# Patient Record
Sex: Male | Born: 1980
Health system: Southern US, Community
[De-identification: ages and names within clinical notes are randomized; demographics above are authoritative.]

## PROBLEM LIST (undated history)

## (undated) DIAGNOSIS — J309 Allergic rhinitis, unspecified: Secondary | ICD-10-CM

## (undated) DIAGNOSIS — T4145XA Adverse effect of unspecified anesthetic, initial encounter: Secondary | ICD-10-CM

## (undated) DIAGNOSIS — S8990XA Unspecified injury of unspecified lower leg, initial encounter: Secondary | ICD-10-CM

## (undated) DIAGNOSIS — M25851 Other specified joint disorders, right hip: Secondary | ICD-10-CM

## (undated) HISTORY — DX: Allergic rhinitis, unspecified: J30.9

## (undated) HISTORY — DX: Unspecified injury of unspecified lower leg, initial encounter: S89.90XA

## (undated) HISTORY — PX: WISDOM TOOTH EXTRACTION: SHX21

---
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
## 2007-04-05 ENCOUNTER — Emergency Department: Payer: Self-pay

## 2008-09-29 ENCOUNTER — Ambulatory Visit: Payer: Self-pay | Admitting: Family Medicine

## 2008-09-29 DIAGNOSIS — J309 Allergic rhinitis, unspecified: Secondary | ICD-10-CM | POA: Insufficient documentation

## 2008-09-29 DIAGNOSIS — R5383 Other fatigue: Secondary | ICD-10-CM

## 2008-09-29 DIAGNOSIS — R5381 Other malaise: Secondary | ICD-10-CM

## 2008-09-30 LAB — CONVERTED CEMR LAB
AST: 22 units/L (ref 0–37)
Albumin: 4.6 g/dL (ref 3.5–5.2)
Alkaline Phosphatase: 47 units/L (ref 39–117)
BUN: 14 mg/dL (ref 6–23)
Basophils Absolute: 0 10*3/uL (ref 0.0–0.1)
Creatinine, Ser: 1 mg/dL (ref 0.4–1.5)
Eosinophils Absolute: 0.2 10*3/uL (ref 0.0–0.7)
GFR calc non Af Amer: 93.13 mL/min (ref >60)
Glucose, Bld: 79 mg/dL (ref 70–99)
Hemoglobin: 14.9 g/dL (ref 13.0–17.0)
Lymphocytes Relative: 21.3 % (ref 12.0–46.0)
MCHC: 35 g/dL (ref 30.0–36.0)
Neutro Abs: 3.5 10*3/uL (ref 1.4–7.7)
Potassium: 4.8 meq/L (ref 3.5–5.1)
RDW: 11.3 % — ABNORMAL LOW (ref 11.5–14.6)
Total Bilirubin: 1 mg/dL (ref 0.3–1.2)

## 2009-03-30 ENCOUNTER — Ambulatory Visit: Payer: Self-pay | Admitting: Family Medicine

## 2010-06-18 ENCOUNTER — Ambulatory Visit: Payer: Self-pay | Admitting: Family Medicine

## 2010-06-23 ENCOUNTER — Ambulatory Visit (INDEPENDENT_AMBULATORY_CARE_PROVIDER_SITE_OTHER): Payer: BC Managed Care – PPO | Admitting: Internal Medicine

## 2010-06-23 ENCOUNTER — Encounter: Payer: Self-pay | Admitting: Internal Medicine

## 2010-06-23 DIAGNOSIS — Z0000 Encounter for general adult medical examination without abnormal findings: Secondary | ICD-10-CM

## 2010-06-28 ENCOUNTER — Other Ambulatory Visit: Payer: Self-pay | Admitting: Internal Medicine

## 2010-06-28 ENCOUNTER — Encounter (INDEPENDENT_AMBULATORY_CARE_PROVIDER_SITE_OTHER): Payer: Self-pay | Admitting: *Deleted

## 2010-06-28 ENCOUNTER — Other Ambulatory Visit (INDEPENDENT_AMBULATORY_CARE_PROVIDER_SITE_OTHER): Payer: BC Managed Care – PPO

## 2010-06-28 DIAGNOSIS — Z0000 Encounter for general adult medical examination without abnormal findings: Secondary | ICD-10-CM

## 2010-06-28 LAB — BASIC METABOLIC PANEL
CO2: 30 mEq/L (ref 19–32)
Calcium: 9.5 mg/dL (ref 8.4–10.5)
Chloride: 103 mEq/L (ref 96–112)
Glucose, Bld: 88 mg/dL (ref 70–99)
Potassium: 4.9 mEq/L (ref 3.5–5.1)
Sodium: 138 mEq/L (ref 135–145)

## 2010-06-28 LAB — CBC WITH DIFFERENTIAL/PLATELET
Basophils Relative: 0.4 % (ref 0.0–3.0)
Eosinophils Relative: 2.7 % (ref 0.0–5.0)
HCT: 41.4 % (ref 39.0–52.0)
Hemoglobin: 14.2 g/dL (ref 13.0–17.0)
Lymphs Abs: 1.3 10*3/uL (ref 0.7–4.0)
Monocytes Relative: 7.5 % (ref 3.0–12.0)
Neutro Abs: 4 10*3/uL (ref 1.4–7.7)
RBC: 4.58 Mil/uL (ref 4.22–5.81)
WBC: 5.9 10*3/uL (ref 4.5–10.5)

## 2010-06-28 LAB — TSH: TSH: 3.12 u[IU]/mL (ref 0.35–5.50)

## 2010-06-28 LAB — LIPID PANEL: HDL: 46.2 mg/dL (ref >39.00)

## 2010-07-01 NOTE — Assessment & Plan Note (Signed)
Summary: CPX , bcbs---ns fee///sph   Vital Signs:  Patient profile:   30 year old male Height:      72.75 inches Weight:      168 pounds BMI:     22.40 Pulse rate:   68 / minute Pulse rhythm:   regular BP sitting:   126 / 80  (left arm) Cuff size:   regular  Vitals Entered By: Army Fossa CMA (June 23, 2010 1:37 PM) CC: New to Est- CPX not fasting  Comments [employer] [place]    History of Present Illness:  new patient to me,   previously seen at a different location needs a CPX  Doing well  compared to a few years ago his energy level is slightly  decreased  but is not preventing him to do anything or limiting any activity. Description of this isssue is vage  Preventive Screening-Counseling & Management  Alcohol-Tobacco     Alcohol type: beer- rarely     Smoking Status: quit  Caffeine-Diet-Exercise     Type of exercise: gym     Times/week: 3  Current Medications (verified): 1)  None  Allergies (verified): No Known Drug Allergies  Past History:  Past Medical History: Allergic rhinitis R knee injury at 30 years old, recurrent effusions R RTC tendinopathy L quad anterior sensory dullness ? Depression in the past   Past Surgical History: Wisdom teeth removed   Family History: Prostate CA-- GF at age 38  colon ca--no ETOH, GF, ? Uncle CAD--no DM-- GF,  GM  Sudden Death--no  Social History: Cristian  "Bo" Occupation: works at a Programme researcher, broadcasting/film/video Married no children  Alcohol use-yes (rare now) Former tobacco, quit  ~ 2006 Drug use-no Regular exercise-yes, regulalrly diet-- healthy mostly   Smoking Status:  quit  Review of Systems General:  Denies weight loss. CV:  Denies chest pain or discomfort, near fainting, palpitations, and swelling of feet. Resp:  Denies cough and shortness of breath. GI:  Denies bloody stools, diarrhea, nausea, and vomiting. GU:  Denies urinary frequency and urinary hesitancy. Psych:  Denies anxiety and  depression.  Physical Exam  General:  alert, well-developed, and well-nourished.   Neck:  no masses, no thyromegaly, and normal carotid upstroke.   Lungs:  normal respiratory effort, no intercostal retractions, no accessory muscle use, and normal breath sounds.   Heart:  normal rate, regular rhythm, no murmur, and no gallop.   Abdomen:  soft, non-tender, no distention, no masses, no guarding, and no rigidity.   Extremities:  no pretibial edema bilaterally  Neurologic:  alert & oriented X3, strength normal in all extremities, and gait normal.   Psych:  Cognition and judgment appear intact. Alert and cooperative with normal attention span and concentration.  not anxious appearing and not depressed appearing.     Impression & Recommendations:  Problem # 1:  ROUTINE GENERAL MEDICAL EXAM@HEALTH  CARE FACL (ICD-V70.0) Td 2010 doing well couneled about diet-exercise-STE  labs   Problem # 2:  FATIGUE (ICD-780.79)  ill-defined lack of energy, if labs are normal I would recommend observation.  Problem # 3:   also on and off rash @  the groin , slightly red and scaly.  exam today is negative Eczema? Hydrocortisone as needed, will call if this  become a bigger issue  Patient Instructions: 1)  came back fasting : 2)  FLP BMP AST ALT TSH CBC----dx V70 3)  Please schedule a follow-up appointment in 1 year.    Orders Added: 1)  Est.  Patient age 70-39 [[id number]]     Risk Factors:  Tobacco use:  quit Alcohol use:  yes    Type:  beer- rarely Exercise:  yes    Times per week:  3    Type:  gym

## 2011-02-18 ENCOUNTER — Telehealth: Payer: Self-pay | Admitting: *Deleted

## 2011-02-18 NOTE — Telephone Encounter (Signed)
Tried to reach patient and they will not answer will advise if call returned

## 2011-02-18 NOTE — Telephone Encounter (Signed)
Yes.. Okay to be seen..  Last OV almost in last 2 years and pt young and should be uncomplicated. Okay to work in.

## 2011-02-18 NOTE — Telephone Encounter (Signed)
Patient never returned my call to get fit in

## 2011-02-18 NOTE — Telephone Encounter (Signed)
Patients spouse called to see if we could see him for an acute visit today.  Patient established care with Dr. Patsy Lager in 2010 but has been seeing a doctor at [facility].  They have since moved back to the area and would like to starting coming to our office again.  Ok to schedule acute visit?  Please advise.

## 2011-03-10 ENCOUNTER — Ambulatory Visit (INDEPENDENT_AMBULATORY_CARE_PROVIDER_SITE_OTHER): Payer: BC Managed Care – PPO | Admitting: Internal Medicine

## 2011-03-10 ENCOUNTER — Encounter: Payer: Self-pay | Admitting: Internal Medicine

## 2011-03-10 VITALS — BP 108/70 | HR 57 | Temp 98.3°F | Ht 73.0 in | Wt 170.2 lb

## 2011-03-10 DIAGNOSIS — J3489 Other specified disorders of nose and nasal sinuses: Secondary | ICD-10-CM

## 2011-03-10 DIAGNOSIS — R05 Cough: Secondary | ICD-10-CM

## 2011-03-10 DIAGNOSIS — J329 Chronic sinusitis, unspecified: Secondary | ICD-10-CM

## 2011-03-10 MED ORDER — AZELASTINE-FLUTICASONE 137-50 MCG/ACT NA SUSP: 2.0000 | NASAL | Status: DC

## 2011-03-10 MED ORDER — AZITHROMYCIN 250 MG PO TABS: ORAL_TABLET | ORAL | Status: AC

## 2011-03-10 NOTE — Patient Instructions (Signed)
Rest, fluids , tylenol For cough, take Mucinex DM twice a day as needed  Dynamista 2 puffs on each side of the nose at night  Take the antibiotic as prescribed ----> zithromax  Call if no better in few days Call anytime if the symptoms are severe

## 2011-03-10 NOTE — Progress Notes (Signed)
  Subjective:    Patient ID: Cristian Fernandez, male    DOB: 05-08-1980, 30 y.o.   MRN: [id number]  HPI 6 weeks history of on and off hoarseness, sinus congestion and some pain and  Discomfort at ears. With the onset of symptoms, he went to urgent care, took 10 days of antibiotics, it provided only temporary help.  Past Medical History: Allergic rhinitis R knee injury at 30 years old, recurrent effusions ? Depression in the past   Past Surgical History: Wisdom teeth removed     Review of Systems No fever, some chills sometimes particularly during the last weekend. He is having yellow nasal discharge and some cough which produced phlegm, he believes phlegm   is rather from the sinus not from the chest. Denies having itchy eyes or itchy nose. No sneezing. No nausea or vomiting    Objective:   Physical Exam  Constitutional: He appears well-developed and well-nourished. No distress.  HENT:  Head: Normocephalic and atraumatic.       Face symmetric, slightly tender at the frontal sinuses. Right tympanic membrane normal, left tympanic membrane is slightly buge. No discharge. Throat w/o redness , no discharge, tonsils small  Cardiovascular: Normal rate, regular rhythm and normal heart sounds.   No murmur heard. Pulmonary/Chest: Effort normal and breath sounds normal. No respiratory distress. He has no wheezes. He has no rales.  Skin: He is not diaphoretic.          Assessment & Plan:  Symptoms consistent with mild but persistent sinusitis, allergic versus bacterial. See instructions

## 2013-04-15 ENCOUNTER — Ambulatory Visit (INDEPENDENT_AMBULATORY_CARE_PROVIDER_SITE_OTHER): Payer: BC Managed Care – PPO | Admitting: Internal Medicine

## 2013-04-15 ENCOUNTER — Encounter: Payer: Self-pay | Admitting: Internal Medicine

## 2013-04-15 VITALS — BP 95/62 | HR 77 | Temp 97.9°F | Wt 172.0 lb

## 2013-04-15 DIAGNOSIS — B9789 Other viral agents as the cause of diseases classified elsewhere: Secondary | ICD-10-CM

## 2013-04-15 DIAGNOSIS — B349 Viral infection, unspecified: Secondary | ICD-10-CM

## 2013-04-15 MED ORDER — AMOXICILLIN 500 MG PO CAPS: 1000.0000 mg | ORAL_CAPSULE | Freq: Two times a day (BID) | ORAL | Status: DC

## 2013-04-15 NOTE — Patient Instructions (Signed)
Rest, fluids , tylenol For cough, take Mucinex DM twice a day as needed  For congestion use OTC Nasocort: 2 nasal sprays on each side of the nose daily until you feel better   Take the antibiotic as prescribed  (Amoxicillin) if no better in few days  Call if no better in few days Call anytime if the symptoms are severe

## 2013-04-15 NOTE — Progress Notes (Signed)
Pre visit review using our clinic review tool, if applicable. No additional management support is needed unless otherwise documented below in the visit note. 

## 2013-04-15 NOTE — Progress Notes (Signed)
   Subjective:    Patient ID: Cristian Fernandez, male    DOB: [birth year], 32 y.o.   MRN: [id number]  HPI Acute visit Symptoms started 10 days ago with nausea and vomiting, symptoms subsided, a week ago developed sinus pressure, sore throat, cough with occasional green sputum. Not taking any medication for his symptoms.  Past Medical History  Diagnosis Date  . Allergic rhinitis   . Knee injury     R knee injury at 32 years old, recurrent effusions   Past Surgical History  Procedure Laterality Date  . Wisdom tooth extraction       History   Social History  . Marital Status: Married    Spouse Name: N/A    Number of Children: 1  . Years of Education: N/A   Occupational History  . desk job    Social History Main Topics  . Smoking status: Former Games developer  . Smokeless tobacco: Never Used  . Alcohol Use: Yes     Comment: wine sometimes   . Drug Use: Not on file  . Sexual Activity: Not on file   Other Topics Concern  . Not on file   Social History Narrative  . No narrative on file    Review of Systems Denies any fevers, did have some chills last week but that is resolved. GI symptoms are now resolved    Objective:   Physical Exam BP 95/62  Pulse 77  Temp(Src) 97.9 F (36.6 C)  Wt 172 lb (78.019 kg)  SpO2 97% General -- alert, well-developed, NAD.  HEENT-- Not pale. TMs normal, throat symmetric, slt red, no discharge. Face symmetric, sinuses not tender to palpation. Nose slt congested.  Lungs -- normal respiratory effort, no intercostal retractions, no accessory muscle use, and few ronchi w/ cough Heart-- normal rate, regular rhythm, no murmur.   Extremities-- no pretibial edema bilaterally   Psych-- Cognition and judgment appear intact. Cooperative with normal attention span and concentration. No anxious appearing , no depressed appearing.      Assessment & Plan:  Viral syndrome, See instructions

## 2014-01-29 ENCOUNTER — Encounter: Payer: Self-pay | Admitting: Medical

## 2014-01-29 ENCOUNTER — Ambulatory Visit (INDEPENDENT_AMBULATORY_CARE_PROVIDER_SITE_OTHER): Payer: BC Managed Care – PPO | Admitting: Medical

## 2014-01-29 VITALS — BP 122/80 | HR 88 | Temp 98.8°F | Ht 72.0 in | Wt 178.8 lb

## 2014-01-29 DIAGNOSIS — T7840XA Allergy, unspecified, initial encounter: Secondary | ICD-10-CM | POA: Insufficient documentation

## 2014-01-29 MED ORDER — METHYLPREDNISOLONE ACETATE 40 MG/ML IJ SUSP: 40.0000 mg | Freq: Once | INTRAMUSCULAR | Status: DC

## 2014-01-29 MED ORDER — HYDROXYZINE HCL 25 MG PO TABS
25.0000 mg | ORAL_TABLET | Freq: Three times a day (TID) | ORAL | Status: DC | PRN
Start: 2014-01-29 — End: 2015-04-09

## 2014-01-29 MED ORDER — PREDNISONE 20 MG PO TABS: ORAL_TABLET | ORAL | Status: DC

## 2014-01-29 NOTE — Patient Instructions (Signed)
You appear to have had allergic reaction to poison ivy or oak. Although this not 100% definite. We gave you depomedrol 40 mg im, prednisone rx and hydroxyzine. If your symptoms worsen or change let us know. I expect gradual improvement over next 7 days.   Follow up 7 days or as needed.

## 2014-01-29 NOTE — Progress Notes (Signed)
   Subjective:    Patient ID: Cristian Fernandez, male    DOB: 1980-08-14, 33 y.o.   MRN: [id number]  HPI  Pt states he has some rash on his left forearm and his let side of his thorax. He has rash on his left forearm. He did see couple of blisters at first. Then spread gradually. Pt does remember doing some yard work around 2 wks ago. These are itching moderately. He put calamine lotion on forearm but nothing on his thorax.  No new creams, soaps, detergents, animal exposure, etc. He only remembers outdoor contact/cleaning yard when rash started.  Past Medical History  Diagnosis Date  . Allergic rhinitis   . Knee injury     R knee injury at 33 years old, recurrent effusions    History   Social History  . Marital Status: Married    Spouse Name: N/A    Number of Children: 1  . Years of Education: N/A   Occupational History  . desk job    Social History Main Topics  . Smoking status: Former Research scientist (life sciences)  . Smokeless tobacco: Never Used  . Alcohol Use: Yes     Comment: wine sometimes   . Drug Use: Not on file  . Sexual Activity: Not on file   Other Topics Concern  . Not on file   Social History Narrative  . No narrative on file    Past Surgical History  Procedure Laterality Date  . Wisdom tooth extraction      No family history on file.  No Known Allergies  No current outpatient prescriptions on file prior to visit.   No current facility-administered medications on file prior to visit.    BP 122/80  Pulse 88  Temp(Src) 98.8 F (37.1 C) (Oral)  Ht 6' (1.829 m)  Wt 178 lb 12.8 oz (81.103 kg)  BMI 24.24 kg/m2  SpO2 97%      Review of Systems  Constitutional: Negative for fever, chills and diaphoresis.  HENT: Negative.   Respiratory: Negative for cough, choking, chest tightness, shortness of breath and wheezing.   Cardiovascular: Negative for chest pain and palpitations.  Gastrointestinal: Negative for nausea, vomiting, abdominal pain, diarrhea, constipation,  blood in stool, abdominal distention and anal bleeding.  Genitourinary: Negative.   Skin: Positive for rash.       Lt forearm and thorax. Area is itching. Started on left forearm first 2 weeks ago.  Neurological: Negative for dizziness, syncope, facial asymmetry, speech difficulty, weakness, light-headedness, numbness and headaches.  Hematological: Negative for adenopathy. Does not bruise/bleed easily.  Psychiatric/Behavioral: Negative.        Objective:   Physical Exam General- No acute distress, Pleasant. Lungs- clear, even and unlabored. Heart- RRR. Skin- left forearm rash and on left side thorax. On forearm scattered scabs were previous vesicles. On thorax just has diffuse rash. No warmth or tenderness to skin. Neurologic- CN III- XII grossly intact.       Assessment & Plan:

## 2015-04-09 ENCOUNTER — Other Ambulatory Visit: Payer: Self-pay

## 2015-04-10 ENCOUNTER — Ambulatory Visit (INDEPENDENT_AMBULATORY_CARE_PROVIDER_SITE_OTHER): Payer: BLUE CROSS/BLUE SHIELD | Admitting: Internal Medicine

## 2015-04-10 ENCOUNTER — Encounter: Payer: Self-pay | Admitting: Internal Medicine

## 2015-04-10 VITALS — BP 102/74 | HR 75 | Temp 98.2°F | Ht 73.0 in | Wt 176.2 lb

## 2015-04-10 DIAGNOSIS — J0100 Acute maxillary sinusitis, unspecified: Secondary | ICD-10-CM | POA: Diagnosis not present

## 2015-04-10 MED ORDER — AZELASTINE HCL 0.1 % NA SOLN: 2.0000 | Freq: Every evening | NASAL | Status: DC | PRN

## 2015-04-10 MED ORDER — AMOXICILLIN 500 MG PO CAPS: 1000.0000 mg | ORAL_CAPSULE | Freq: Two times a day (BID) | ORAL | Status: DC

## 2015-04-10 MED ORDER — PREDNISONE 10 MG PO TABS: 10.0000 mg | ORAL_TABLET | Freq: Every day | ORAL | Status: DC

## 2015-04-10 NOTE — Progress Notes (Signed)
   Subjective:    Patient ID: Cristian Fernandez, male    DOB: [birth year], 34 y.o.   MRN: [id number]  DOS:  04/10/2015 Type of visit - description : Acute Interval history: Having respiratory symptoms for the last 2 months, started with flulike illness in October, he recuperated well, then about 3 weeks ago for sinus congestion again, runny nose, hoarseness. Was prescribed a Z-Pak and Tessalon Perles, improved temporarily but now is back with essentially same symptoms: Continue with runny nose, sinus pressure and congestion, cough with very little sputum production   Review of Systems No fever or chills Had sore throat with the onset of symptoms only + Yellow nasal discharge. Some chest congestion. No sneezing or itchy eyes or nose  Past Medical History  Diagnosis Date  . Allergic rhinitis   . Knee injury     R knee injury at 34 years old, recurrent effusions    Past Surgical History  Procedure Laterality Date  . Wisdom tooth extraction      Social History   Social History  . Marital Status: Married    Spouse Name: N/A  . Number of Children: 1  . Years of Education: N/A   Occupational History  . desk job    Social History Main Topics  . Smoking status: Former Research scientist (life sciences)  . Smokeless tobacco: Never Used  . Alcohol Use: Yes     Comment: wine sometimes   . Drug Use: Not on file  . Sexual Activity: Not on file   Other Topics Concern  . Not on file   Social History Narrative        Medication List       This list is accurate as of: 04/10/15 11:59 PM.  Always use your most recent med list.               amoxicillin 500 MG capsule  Commonly known as:  AMOXIL  Take 2 capsules (1,000 mg total) by mouth 2 (two) times daily.     azelastine 0.1 % nasal spray  Commonly known as:  ASTELIN  Place 2 sprays into both nostrils at bedtime as needed for rhinitis. Use in each nostril as directed     predniSONE 10 MG tablet  Commonly known as:  DELTASONE  Take 1 tablet  (10 mg total) by mouth daily. 2 tabs a day x 5 days           Objective:   Physical Exam BP 102/74 mmHg  Pulse 75  Temp(Src) 98.2 F (36.8 C) (Oral)  Ht 6\' 1"  (1.854 m)  Wt 176 lb 4 oz (79.946 kg)  BMI 23.26 kg/m2  SpO2 97% General:   Well developed, well nourished . NAD.  HEENT:  Normocephalic . Face symmetric, atraumatic Nose: Slightly congested, sinuses mildly TTP @ bilateral maxillary areas close to the nose Lungs:  CTA B Normal respiratory effort, no intercostal retractions, no accessory muscle use. Heart: RRR,  no murmur.  No pretibial edema bilaterally  Skin: Not pale. Not jaundice Neurologic:  alert & oriented X3.  Speech normal, gait appropriate for age and unassisted Psych--  Cognition and judgment appear intact.  Cooperative with normal attention span and concentration.  Behavior appropriate. No anxious or depressed appearing.      Assessment & Plan:   Partially treated sinusitis: Amoxicillin, prednisone, see instructions.

## 2015-04-10 NOTE — Progress Notes (Signed)
Pre visit review using our clinic review tool, if applicable. No additional management support is needed unless otherwise documented below in the visit note. 

## 2015-04-10 NOTE — Patient Instructions (Addendum)
Rest, fluids , tylenol  For cough: Take Mucinex DM twice a day as needed until better  For nasal congestion Use OTC  Flonase : 2 nasal sprays on each side of the nose in the morning  until you feel better Prescribed astelin :  2 nasal sprays at night  Prednisone for fey days    Take the antibiotic as prescribed   Call if not gradually better over the next  10 days  Call anytime if the symptoms are severe

## 2016-05-31 ENCOUNTER — Telehealth: Payer: Self-pay | Admitting: Internal Medicine

## 2016-05-31 NOTE — Telephone Encounter (Signed)
Pt called and would to transfer care from Dr. Larose Kells to Dr. Lacinda Axon. He is moving closer to [place]. Please advise, thank you!  Call pt @ [phone]

## 2016-05-31 NOTE — Telephone Encounter (Signed)
Okay with me 

## 2016-05-31 NOTE — Telephone Encounter (Signed)
Scheduled with Dr. Lacinda Axon on 2/12 @ 8 am.

## 2016-05-31 NOTE — Telephone Encounter (Signed)
That's fine, Thank you!

## 2016-06-06 ENCOUNTER — Encounter: Payer: Self-pay | Admitting: Family Medicine

## 2016-06-06 ENCOUNTER — Ambulatory Visit (INDEPENDENT_AMBULATORY_CARE_PROVIDER_SITE_OTHER): Payer: BLUE CROSS/BLUE SHIELD | Admitting: Family Medicine

## 2016-06-06 VITALS — BP 116/78 | HR 58 | Temp 98.2°F | Wt 184.8 lb

## 2016-06-06 DIAGNOSIS — Z0000 Encounter for general adult medical examination without abnormal findings: Secondary | ICD-10-CM

## 2016-06-06 LAB — COMPREHENSIVE METABOLIC PANEL
ALT: 21 U/L (ref 0–53)
AST: 22 U/L (ref 0–37)
Albumin: 4.2 g/dL (ref 3.5–5.2)
Alkaline Phosphatase: 54 U/L (ref 39–117)
BUN: 13 mg/dL (ref 6–23)
CO2: 30 meq/L (ref 19–32)
Calcium: 8.9 mg/dL (ref 8.4–10.5)
Chloride: 107 mEq/L (ref 96–112)
Creatinine, Ser: 1.04 mg/dL (ref 0.40–1.50)
GFR: 85.01 mL/min (ref >60.00)
GLUCOSE: 109 mg/dL — AB (ref 70–99)
Potassium: 4.5 mEq/L (ref 3.5–5.1)
SODIUM: 141 meq/L (ref 135–145)
TOTAL PROTEIN: 6.3 g/dL (ref 6.0–8.3)
Total Bilirubin: 0.3 mg/dL (ref 0.2–1.2)

## 2016-06-06 LAB — LIPID PANEL
CHOLESTEROL: 157 mg/dL (ref 0–200)
HDL: 45.3 mg/dL (ref >39.00)
LDL Cholesterol: 91 mg/dL (ref 0–99)
NonHDL: 111.41
Total CHOL/HDL Ratio: 3
Triglycerides: 101 mg/dL (ref 0.0–149.0)
VLDL: 20.2 mg/dL (ref 0.0–40.0)

## 2016-06-06 LAB — CBC
HCT: 40.4 % (ref 39.0–52.0)
Hemoglobin: 13.7 g/dL (ref 13.0–17.0)
MCHC: 33.8 g/dL (ref 30.0–36.0)
MCV: 87.4 fl (ref 78.0–100.0)
Platelets: 164 10*3/uL (ref 150.0–400.0)
RBC: 4.62 Mil/uL (ref 4.22–5.81)
RDW: 13 % (ref 11.5–15.5)
WBC: 4.3 10*3/uL (ref 4.0–10.5)

## 2016-06-06 LAB — HEMOGLOBIN A1C: HEMOGLOBIN A1C: 5.3 % (ref 4.6–6.5)

## 2016-06-06 NOTE — Progress Notes (Signed)
Subjective:  Patient ID: Cristian Fernandez, male    DOB: [birth year]  Age: 36 y.o. MRN: [id number]  CC: Establish care, physical.   HPI Cristian Fernandez is a 36 y.o. male presents to the clinic today for a physical exam. He has no complaints or concerns at this time.  Preventative Healthcare  Immunizations  Tetanus - Up to date.   Flu - Declines.   Labs: Screening labs today.  Exercise: Reports he tries to exercise and stay active.   Alcohol use: Occ.  Smoking/tobacco use: Former.  STD/HIV testing: Declines.   PMH, Surgical Hx, Family Hx, Social History reviewed and updated as below.  Past Medical History:  Diagnosis Date  . Allergic rhinitis   . Knee injury    R knee injury at 36 years old, recurrent effusions   Past Surgical History:  Procedure Laterality Date  . WISDOM TOOTH EXTRACTION     Family History  Problem Relation Age of Onset  . Diabetes Mother   . Benign prostatic hyperplasia Father   . Prostate cancer Paternal Grandfather    Social History  Substance Use Topics  . Smoking status: Former Research scientist (life sciences)  . Smokeless tobacco: Never Used  . Alcohol use Yes     Comment: wine sometimes    Review of Systems General: Denies unexplained weight loss, fever. Skin: Denies new or changing mole, sore/wound that won't heal. ENT: Trouble hearing, ringing in the ears, sores in the mouth, hoarseness, trouble swallowing. Eyes: Denies trouble seeing/visual disturbance. Heart/CV: Denies chest pain, shortness of breath, edema, palpitations. Lungs/Resp: Denies cough, shortness of breath, hemoptysis. Abd/GI: Denies nausea, vomiting, diarrhea, constipation, abdominal pain, hematochezia, melena. GU: Denies dysuria, incontinence, hematuria, urinary frequency, difficulty starting/keeping stream, penile discharge, sexual difficulty, lump in testicles. MSK: Denies joint pain/swelling, myalgias. Neuro: Denies headaches, weakness, numbness, dizziness, syncope. Psych: Denies sadness,  anxiety, stress, memory difficulty. Endocrine: Denies polyuria and polydipsia.  Objective:   Today's Vitals: BP 116/78   Pulse (!) 58   Temp 98.2 F (36.8 C) (Oral)   Wt 184 lb 12.8 oz (83.8 kg)   SpO2 99%   BMI 24.38 kg/m   Physical Exam  Constitutional: He is oriented to person, place, and time. He appears well-developed and well-nourished. No distress.  HENT:  Head: Normocephalic and atraumatic.  Nose: Nose normal.  Mouth/Throat: Oropharynx is clear and moist. No oropharyngeal exudate.  Normal TM's bilaterally.   Eyes: Conjunctivae are normal. No scleral icterus.  Neck: Neck supple.  Cardiovascular: Normal rate and regular rhythm.   No murmur heard. Pulmonary/Chest: Effort normal and breath sounds normal. He has no wheezes. He has no rales.  Abdominal: Soft. He exhibits no distension. There is no tenderness. There is no rebound and no guarding.  Musculoskeletal: Normal range of motion. He exhibits no edema.  Lymphadenopathy:    He has no cervical adenopathy.  Neurological: He is alert and oriented to person, place, and time.  Skin: Skin is warm and dry. No rash noted.  Psychiatric: He has a normal mood and affect.  Vitals reviewed.  Assessment & Plan:   Problem List Items Addressed This Visit    Annual physical exam - Primary    Tetanus up-to-date. Declines flu. Declined HIV screening. Screening labs today.      Relevant Orders   CBC   Hemoglobin A1c   Comprehensive metabolic panel   Lipid panel      Outpatient Encounter Prescriptions as of 06/06/2016  Medication Sig  . [DISCONTINUED] amoxicillin (AMOXIL) 500  MG capsule Take 2 capsules (1,000 mg total) by mouth 2 (two) times daily.  . [DISCONTINUED] azelastine (ASTELIN) 0.1 % nasal spray Place 2 sprays into both nostrils at bedtime as needed for rhinitis. Use in each nostril as directed  . [DISCONTINUED] predniSONE (DELTASONE) 10 MG tablet Take 1 tablet (10 mg total) by mouth daily. 2 tabs a day x 5 days    No facility-administered encounter medications on file as of 06/06/2016.     Follow-up: 1-2 years.  [place]

## 2016-06-06 NOTE — Patient Instructions (Signed)

## 2016-06-06 NOTE — Assessment & Plan Note (Signed)
Tetanus up-to-date. Declines flu. Declined HIV screening. Screening labs today.

## 2016-06-06 NOTE — Progress Notes (Signed)
Pre visit review using our clinic review tool, if applicable. No additional management support is needed unless otherwise documented below in the visit note. 

## 2017-10-06 DIAGNOSIS — D485 Neoplasm of uncertain behavior of skin: Secondary | ICD-10-CM | POA: Diagnosis not present

## 2017-10-06 DIAGNOSIS — L821 Other seborrheic keratosis: Secondary | ICD-10-CM | POA: Diagnosis not present

## 2017-10-06 DIAGNOSIS — L812 Freckles: Secondary | ICD-10-CM | POA: Diagnosis not present

## 2017-10-06 DIAGNOSIS — Z1283 Encounter for screening for malignant neoplasm of skin: Secondary | ICD-10-CM | POA: Diagnosis not present

## 2017-10-06 DIAGNOSIS — D225 Melanocytic nevi of trunk: Secondary | ICD-10-CM | POA: Diagnosis not present

## 2019-01-24 ENCOUNTER — Ambulatory Visit: Payer: BLUE CROSS/BLUE SHIELD | Admitting: Internal Medicine

## 2019-04-26 DIAGNOSIS — J189 Pneumonia, unspecified organism: Secondary | ICD-10-CM

## 2019-04-26 HISTORY — DX: Pneumonia, unspecified organism: J18.9

## 2019-05-14 ENCOUNTER — Ambulatory Visit
Admission: EM | Admit: 2019-05-14 | Discharge: 2019-05-14 | Disposition: A | Discharge status: Home / Self Care | Payer: Managed Care, Other (non HMO) | Attending: Urgent Care | Admitting: Urgent Care

## 2019-05-14 ENCOUNTER — Encounter: Payer: Self-pay | Admitting: Emergency Medicine

## 2019-05-14 ENCOUNTER — Other Ambulatory Visit: Payer: Self-pay

## 2019-05-14 DIAGNOSIS — Z20822 Contact with and (suspected) exposure to covid-19: Secondary | ICD-10-CM | POA: Diagnosis not present

## 2019-05-14 DIAGNOSIS — J069 Acute upper respiratory infection, unspecified: Secondary | ICD-10-CM | POA: Diagnosis not present

## 2019-05-14 DIAGNOSIS — Z7189 Other specified counseling: Secondary | ICD-10-CM | POA: Insufficient documentation

## 2019-05-14 LAB — SARS CORONAVIRUS 2 AG (30 MIN TAT): SARS Coronavirus 2 Ag: NEGATIVE

## 2019-05-14 NOTE — Discharge Instructions (Addendum)
It was very nice seeing you today in clinic. Thank you for entrusting me with your care.   Rest and stay HYDRATED. Water and electrolyte containing beverages (Gatorade, Pedialyte) are best to prevent dehydration and electrolyte abnormalities.   May use Tylenol and/or Ibuprofen as needed for pain/fever.   You were tested for SARS-CoV-2 (novel coronavirus) today. Testing is performed by an outside lab ([facility]) and has variable turn around times ranging between 2-5 days. Current recommendations from the the [facility] and [facility] DHHS require that you remain at home until negative test results are have been received. In the event that your test results are positive, you will be contacted with further directives. These measures are being implemented out of an abundance of caution to prevent transmission and spread during the current SARS-CoV-2 pandemic.  Make arrangements to follow up with your regular doctor in 1 week for re-evaluation if not improving. If your symptoms/condition worsens, please seek follow up care either here or in the ER. Please remember, our [place] providers are "right here with you" when you need [place].   Again, it was my pleasure to take care of you today. Thank you for choosing our clinic. I hope that you start to feel better quickly.   Honor Loh, MSN, APRN, FNP-C, CEN Advanced Practice Provider [facility]

## 2019-05-14 NOTE — ED Triage Notes (Addendum)
Patient in today c/o dizziness, nasal congestion, cough, fever (102 last night) and headache x 1 day. Patient states he was having sinus issues in December, but they resolved.

## 2019-05-15 LAB — NOVEL CORONAVIRUS, NAA (HOSP ORDER, SEND-OUT TO REF LAB; TAT 18-24 HRS): SARS-CoV-2, NAA: NOT DETECTED

## 2019-05-15 NOTE — ED Provider Notes (Signed)
[place], [place]   Name: Cristian Fernandez DOB: 04/29/1980 MRN: [id number] CSN: [id number] PCP: Coral Spikes, DO  Arrival date and time:  05/14/19 1423  Chief Complaint:  Dizziness, Nasal Congestion, Cough, and Headache   NOTE: Prior to seeing the patient today, I have reviewed the triage nursing documentation and vital signs. Clinical staff has updated patient's PMH/PSHx, current medication list, and drug allergies/intolerances to ensure comprehensive history available to assist in medical decision making.   History:   HPI: Cristian Fernandez is a 39 y.o. male who presents today with complaints of cough, rhinorrhea, vertiginous symptoms, shortness of breath, dry mouth, and a generalized headache that started approximately 1-2 days ago. Patient has been having sinus issues since Christmas. Patient has been running an elevated temperature, and reports that his Tmax has been 102. Fevers respond appropriately to antipyretics; 99.5 in clinic. Cough is productive of yellow sputum. No wheezing. He denies that he has experienced any nausea, vomiting, diarrhea, or abdominal pain. He is drinking well, however notes a decreased appetite overall. Patient denies any perceived alterations to his sense of taste or smell. Patient denies being in close contact with anyone known to be ill; no one else is his home has experienced a similar symptom constellation. He has not been tested for SARS-CoV-2 (novel coronavirus) in the past 14 days; last tested negative in November 2020 per his report. Patient has not been vaccinated for influenza this season. In efforts to conservatively manage his symptoms at home, the patient notes that he has used guaifenesin, which has not really helped to improve his symptoms.    Past Medical History:  Diagnosis Date  . Allergic rhinitis   . Knee injury    R knee injury at 39 years old, recurrent effusions    Past Surgical History:  Procedure Laterality Date  . WISDOM TOOTH EXTRACTION       Family History  Problem Relation Age of Onset  . Diabetes Mother   . Benign prostatic hyperplasia Father   . Prostate cancer Paternal Grandfather     Social History   Tobacco Use  . Smoking status: Former Smoker    Quit date: 2006    Years since quitting: 15.0  . Smokeless tobacco: Never Used  Substance Use Topics  . Alcohol use: Yes    Comment: wine sometimes   . Drug use: Never    Patient Active Problem List   Diagnosis Date Noted  . Annual physical exam 06/06/2016    Home Medications:    No outpatient medications have been marked as taking for the 05/14/19 encounter [facility] Encounter).    Allergies:   Patient has no known allergies.  Review of Systems (ROS): Review of Systems  Constitutional: Positive for fatigue and fever.  HENT: Positive for congestion, rhinorrhea and sinus pressure. Negative for ear pain, postnasal drip, sneezing and sore throat.        (+) xerostomia  Eyes: Negative for pain, discharge and redness.  Respiratory: Positive for cough and shortness of breath. Negative for chest tightness.   Cardiovascular: Negative for chest pain and palpitations.  Gastrointestinal: Negative for abdominal pain, diarrhea, nausea and vomiting.  Musculoskeletal: Negative for arthralgias, back pain, myalgias and neck pain.  Skin: Negative for color change, pallor and rash.  Neurological: Positive for dizziness and headaches. Negative for syncope and weakness.  Hematological: Negative for adenopathy.     Vital Signs: Today's Vitals   05/14/19 1434 05/14/19 1435 05/14/19 1436 05/14/19 1521  BP:  99/71   Pulse:   (!) 113   Resp:   16   Temp:   99.5 F (37.5 C)   TempSrc:   Oral   SpO2:   95%   Weight:  162 lb (73.5 kg)    Height:  6' 1"  (1.854 m)    PainSc: 8    8     Physical Exam: Physical Exam  Constitutional: He is oriented to person, place, and time and well-developed, well-nourished, and in no distress.  HENT:  Head: Normocephalic and  atraumatic.  Nose: Mucosal edema, rhinorrhea and sinus tenderness (pressure) present.  Mouth/Throat: Uvula is midline and mucous membranes are normal. Posterior oropharyngeal erythema (mild with (+) clear PND) present. No oropharyngeal exudate or posterior oropharyngeal edema.  Eyes: Pupils are equal, round, and reactive to light.  Cardiovascular: Regular rhythm, normal heart sounds and intact distal pulses. Tachycardia present.  Pulmonary/Chest: Effort normal. He has rhonchi (upper airways; clears with cough).  Mild cough noted in clinic. No SOB or increased WOB. No distress. Able to speak in complete sentences without difficulties. SPO2 95% on RA.  Musculoskeletal:     Cervical back: Normal range of motion and neck supple.  Lymphadenopathy:    He has no cervical adenopathy.  Neurological: He is alert and oriented to person, place, and time. Gait normal.  Skin: Skin is warm and dry. No rash noted. He is not diaphoretic.  Psychiatric: Mood, memory, affect and judgment normal.  Nursing note and vitals reviewed.   Urgent Care Treatments / Results:   Orders Placed This Encounter  Procedures  . SARS Coronavirus 2 Ag (30 min TAT) - Nasal Swab (BD Veritor Kit)  . Novel Coronavirus, NAA (Hosp order, Send-out to Ref Lab; TAT 18-24 hrs    LABS: PLEASE NOTE: all labs that were ordered this encounter are listed, however only abnormal results are displayed. Labs Reviewed  SARS CORONAVIRUS 2 AG (30 MIN TAT)  NOVEL CORONAVIRUS, NAA (HOSP ORDER, SEND-OUT TO REF LAB; TAT 18-24 HRS)    EKG: -None  RADIOLOGY: No results found.  PROCEDURES: Procedures  MEDICATIONS RECEIVED THIS VISIT: Medications - No data to display  PERTINENT CLINICAL COURSE NOTES/UPDATES:   Initial Impression / Assessment and Plan / Urgent Care Course:  Pertinent labs & imaging results that were available during my care of the patient were personally reviewed by me and considered in my medical decision making (see  lab/imaging section of note for values and interpretations).  Cristian Fernandez is a 39 y.o. male who presents to [facility] today with complaints of Dizziness, Nasal Congestion, Cough, and Headache  Patient overall well appearing and in no acute distress today in clinic. Presenting symptoms (see HPI) and exam as documented above. He presents with symptoms associated with SARS-CoV-2 (novel coronavirus). Discussed typical symptom constellation. Reviewed potential for infection and need for testing. Patient amenable to being tested. Rapid SARS-CoV-2 Ag swab collected by certified clinical staff; results negative. Given symptom constellation and exposure the decision was made to send more sensitive molecular PCR testing to further assess for the patient being infected with the SARS-CoV-2 virus. Discussed variable turn around times associated with testing, as swabs are being processed at [facility], and have been taking between 24-48 hours to come back. He was advised to self quarantine, per [facility] DHHS guidelines, until negative results received. These measures are being implemented out of an abundance of caution to prevent transmission and spread during the current SARS-CoV-2 pandemic.  Sinus issues have been  going on since Christmas .(+) acute exacerbation yesterday with added symptoms (see HPI). Dicussed may be of bacterial etiology, however until ruled out with confirmatory lab testing, SARS-CoV-2 remains part of the differential. His testing is pending at this time. Will await SARS-CoV-2 results. If negative and patient does not feel like he is improving, will consider antimicrobial treatment. In the interim, will pursue supportive care measures at home during acute phase of illness. Patient to rest as much as possible. He was encouraged to ensure adequate hydration (water and ORS) to prevent dehydration and electrolyte derangements. Patient may use APAP and/or IBU on an as needed basis for pain/fever.  Intervention for cough offered, however patient declined citing that his symptoms are mild/controlled.   Discussed follow up with primary care physician in 1 week for re-evaluation. I have reviewed the follow up and strict return precautions for any new or worsening symptoms. Patient is aware of symptoms that would be deemed urgent/emergent, and would thus require further evaluation either here or in the emergency department. At the time of discharge, he verbalized understanding and consent with the discharge plan as it was reviewed with him. All questions were fielded by provider and/or clinic staff prior to patient discharge.    Final Clinical Impressions / Urgent Care Diagnoses:   Final diagnoses:  Upper respiratory tract infection, unspecified type  Encounter for laboratory testing for COVID-19 virus  Advice given about COVID-19 virus infection    New Prescriptions:  [place] Controlled Substance Registry consulted? Not Applicable  No orders of the defined types were placed in this encounter.   Recommended Follow up Care:  Patient encouraged to follow up with the following provider within the specified time frame, or sooner as dictated by the severity of his symptoms. As always, he was instructed that for any urgent/emergent care needs, he should seek care either here or in the emergency department for more immediate evaluation.  Follow-up Information    PCP In 1 week.   Why: General reassessment of symptoms if not improving        NOTE: This note was prepared using Lobbyist along with smaller Company secretary. Despite my best ability to proofread, there is the potential that transcriptional errors may still occur from this process, and are completely unintentional.    Karen Kitchens, NP 05/15/19 1934

## 2019-05-16 ENCOUNTER — Telehealth: Payer: Self-pay | Admitting: Emergency Medicine

## 2019-05-16 MED ORDER — AMOXICILLIN-POT CLAVULANATE 875-125 MG PO TABS: 1.0000 | ORAL_TABLET | Freq: Two times a day (BID) | ORAL | 0 refills | Status: DC

## 2019-05-16 NOTE — Telephone Encounter (Signed)
Patient called back stating COVID test was negative. Patient states he is still having symptoms. Spoke with Honor Loh. Will send in Augmentin BID x 10 days to [facility] in [place].

## 2019-05-20 ENCOUNTER — Ambulatory Visit
Admission: EM | Admit: 2019-05-20 | Discharge: 2019-05-20 | Disposition: A | Discharge status: Home / Self Care | Payer: Managed Care, Other (non HMO) | Attending: Family Medicine | Admitting: Family Medicine

## 2019-05-20 ENCOUNTER — Other Ambulatory Visit: Payer: Self-pay

## 2019-05-20 ENCOUNTER — Ambulatory Visit (INDEPENDENT_AMBULATORY_CARE_PROVIDER_SITE_OTHER): Payer: Managed Care, Other (non HMO)

## 2019-05-20 DIAGNOSIS — J189 Pneumonia, unspecified organism: Secondary | ICD-10-CM

## 2019-05-20 DIAGNOSIS — R05 Cough: Secondary | ICD-10-CM

## 2019-05-20 MED ORDER — AMOXICILLIN-POT CLAVULANATE ER 1000-62.5 MG PO TB12: 2.0000 | ORAL_TABLET | Freq: Two times a day (BID) | ORAL | 0 refills | Status: AC

## 2019-05-20 MED ORDER — AZITHROMYCIN 250 MG PO TABS: ORAL_TABLET | ORAL | 0 refills | Status: DC

## 2019-05-20 NOTE — ED Provider Notes (Signed)
[facility]    CSN: [id number] Arrival date & time: 05/20/19  1012      History   Chief Complaint Chief Complaint  Patient presents with  . Cough  . Nasal Congestion    HPI Cristian Fernandez is a 39 y.o. male.   39 yo male with a c/o cough, nasal drainage, fatigue and shortness of breath for over 1 week. Patient seen about 1 week ago and covid test negative. States he is not improving.      Past Medical History:  Diagnosis Date  . Allergic rhinitis   . Knee injury    R knee injury at 39 years old, recurrent effusions    Patient Active Problem List   Diagnosis Date Noted  . Annual physical exam 06/06/2016    Past Surgical History:  Procedure Laterality Date  . WISDOM TOOTH EXTRACTION         Home Medications    Prior to Admission medications   Medication Sig Start Date End Date Taking? Authorizing Provider  amoxicillin-clavulanate (AUGMENTIN XR) 1000-62.5 MG 12 hr tablet Take 2 tablets by mouth 2 (two) times daily for 7 days. 05/20/19 05/27/19  Norval Gable, MD  azithromycin (ZITHROMAX Z-PAK) 250 MG tablet 2 tabs po once day 1, then 1 tab po qd for next 4 days 05/20/19   Norval Gable, MD    Family History Family History  Problem Relation Age of Onset  . Diabetes Mother   . Benign prostatic hyperplasia Father   . Prostate cancer Paternal Grandfather     Social History Social History   Tobacco Use  . Smoking status: Former Smoker    Quit date: 2006    Years since quitting: 15.0  . Smokeless tobacco: Never Used  Substance Use Topics  . Alcohol use: Yes    Comment: wine sometimes   . Drug use: Never     Allergies   Patient has no known allergies.   Review of Systems Review of Systems   Physical Exam Triage Vital Signs ED Triage Vitals  Enc Vitals Group     BP 05/20/19 1037 (!) 98/58     Pulse Rate 05/20/19 1037 93     Resp --      Temp 05/20/19 1037 99.4 F (37.4 C)     Temp Source 05/20/19 1037 Oral     SpO2 05/20/19  1037 96 %     Weight 05/20/19 1036 162 lb (73.5 kg)     Height 05/20/19 1036 6\' 1"  (1.854 m)     Head Circumference --      Peak Flow --      Pain Score 05/20/19 1035 0     Pain Loc --      Pain Edu? --      Excl. in [place]? --    No data found.  Updated Vital Signs BP (!) 98/58 (BP Location: Left Arm)   Pulse 93   Temp 99.4 F (37.4 C) (Oral)   Ht 6\' 1"  (1.854 m)   Wt 73.5 kg   SpO2 96%   BMI 21.37 kg/m   Visual Acuity Right Eye Distance:   Left Eye Distance:   Bilateral Distance:    Right Eye Near:   Left Eye Near:    Bilateral Near:     Physical Exam Vitals and nursing note reviewed.  Constitutional:      General: He is not in acute distress.    Appearance: He is not toxic-appearing or diaphoretic.  Cardiovascular:     Rate and Rhythm: Normal rate.     Heart sounds: Normal heart sounds.  Pulmonary:     Effort: Pulmonary effort is normal. No respiratory distress.     Breath sounds: Rales (bibasilar) present.  Musculoskeletal:     Cervical back: Neck supple.  Neurological:     Mental Status: He is alert.      UC Treatments / Results  Labs (all labs ordered are listed, but only abnormal results are displayed) Labs Reviewed - No data to display  EKG   Radiology No results found.  Procedures Procedures (including critical care time)  Medications Ordered in UC Medications - No data to display  Initial Impression / Assessment and Plan / UC Course  I have reviewed the triage vital signs and the nursing notes.  Pertinent labs & imaging results that were available during my care of the patient were reviewed by me and considered in my medical decision making (see chart for details).      Final Clinical Impressions(s) / UC Diagnoses   Final diagnoses:  Community acquired pneumonia, unspecified laterality     Discharge Instructions     Rest, fluids, over the counter medications as needed Follow up in 3-4 weeks with Primary Care Provider for  repeat chest x-ray Go to Emergency Department if worsening symptoms    ED Prescriptions    Medication Sig Dispense Auth. Provider   amoxicillin-clavulanate (AUGMENTIN XR) 1000-62.5 MG 12 hr tablet Take 2 tablets by mouth 2 (two) times daily for 7 days. 28 tablet Benedict Kue, MD   azithromycin (ZITHROMAX Z-PAK) 250 MG tablet 2 tabs po once day 1, then 1 tab po qd for next 4 days 6 each Norval Gable, MD      1. Chest x-ray results and diagnosis reviewed with patient 2. rx as per orders above; reviewed possible side effects, interactions, risks and benefits  3. Recommend supportive treatment as above 4. Follow-up prn if symptoms worsen or don't improve   PDMP not reviewed this encounter.   Norval Gable, MD 05/22/19 1140

## 2019-05-20 NOTE — Discharge Instructions (Addendum)
Rest, fluids, over the counter medications as needed Follow up in 3-4 weeks with Primary Care Provider for repeat chest x-ray Go to Emergency Department if worsening symptoms

## 2019-05-20 NOTE — ED Triage Notes (Signed)
Pt presents with c/o ongoing sinus issues. He was tested for COVID last week and it was negative. He reports continued cough, nasal drainage and shob. He states he has been checking his O2 on room air and it was 82-90. He would like to be reassessed.

## 2019-05-27 ENCOUNTER — Encounter: Payer: Self-pay | Admitting: Family Medicine

## 2019-05-27 ENCOUNTER — Other Ambulatory Visit: Payer: Self-pay

## 2019-05-27 ENCOUNTER — Ambulatory Visit (INDEPENDENT_AMBULATORY_CARE_PROVIDER_SITE_OTHER): Payer: Managed Care, Other (non HMO) | Admitting: Family Medicine

## 2019-05-27 DIAGNOSIS — J189 Pneumonia, unspecified organism: Secondary | ICD-10-CM | POA: Insufficient documentation

## 2019-05-27 NOTE — Assessment & Plan Note (Signed)
Patient with likely bacterial pneumonia given improvement with antibiotics.  He will complete his Augmentin.  He has finished his azithromycin.  He will monitor for any worsening of symptoms once antibiotics have been finished.  Discussed that it may take several weeks for him to get back to his baseline.  We will have him complete a follow-up chest x-ray around 1 month after his initial chest x-ray.  He will go to the medical mall at the hospital to have this completed.  Discussed if he has any worsening symptoms he needs to contact us.

## 2019-05-27 NOTE — Progress Notes (Signed)
Virtual Visit via video Note  This visit type was conducted due to national recommendations for restrictions regarding the COVID-19 pandemic (e.g. social distancing).  This format is felt to be most appropriate for this patient at this time.  All issues noted in this document were discussed and addressed.  No physical exam was performed (except for noted visual exam findings with Video Visits).   I connected with Cristian Fernandez today at  1:45 PM EST by a video enabled telemedicine application and verified that I am speaking with the correct person using two identifiers. Location patient: home Location provider: work  Persons participating in the virtual visit: patient, provider  I discussed the limitations, risks, security and privacy concerns of performing an evaluation and management service by telephone and the availability of in person appointments. I also discussed with the patient that there may be a patient responsible charge related to this service. The patient expressed understanding and agreed to proceed.  Reason for visit: follow-up  HPI: Pneumonia: Patient notes symptoms started around Christmas with sinus cold symptoms.  He treated with over-the-counter medication and got a little bit better though subsequently got worse about 2 weeks ago.  He developed dizziness with presyncope and headaches.  He had a temperature of 102 F.  Also with sinus congestion and cough.  Some sweats.  He went to urgent care and had a negative rapid Covid test and then a negative Covid PCR test.  They placed him on amoxicillin which did help some initially though after several days he started to feel worse and his oxygen dropped and his heart rate went up.  He went back to urgent care and they placed him on high-dose Augmentin and azithromycin.  He has progressively improved with those antibiotics.  He has had no additional dizziness.  No fevers.  Headaches are resolved.  He is about 75% better.  He does still  get a little winded and has some fatigue though overall feels quite a bit better.  Chest x-ray on his second visit to urgent care revealed heterogeneous consolidative airspace opacities of the left greater than right lung bases most consistent with infection or aspiration.     ROS: See pertinent positives and negatives per HPI.  Past Medical History:  Diagnosis Date  . Allergic rhinitis   . Knee injury    R knee injury at 39 years old, recurrent effusions    Past Surgical History:  Procedure Laterality Date  . WISDOM TOOTH EXTRACTION      Family History  Problem Relation Age of Onset  . Diabetes Mother   . Benign prostatic hyperplasia Father   . Prostate cancer Paternal Grandfather     SOCIAL HX: Former smoker   Current Outpatient Medications:  .  amoxicillin-clavulanate (AUGMENTIN XR) 1000-62.5 MG 12 hr tablet, Take 2 tablets by mouth 2 (two) times daily for 7 days., Disp: 28 tablet, Rfl: 0 .  azithromycin (ZITHROMAX Z-PAK) 250 MG tablet, 2 tabs po once day 1, then 1 tab po qd for next 4 days, Disp: 6 each, Rfl: 0  EXAM:  VITALS per patient if applicable:  GENERAL: alert, oriented, appears well and in no acute distress  HEENT: atraumatic, conjunttiva clear, no obvious abnormalities on inspection of external nose and ears  NECK: normal movements of the head and neck  LUNGS: on inspection no signs of respiratory distress, breathing rate appears normal, no obvious gross SOB, gasping or wheezing  CV: no obvious cyanosis  MS: moves all visible  extremities without noticeable abnormality  PSYCH/NEURO: pleasant and cooperative, no obvious depression or anxiety, speech and thought processing grossly intact  ASSESSMENT AND PLAN:  Discussed the following assessment and plan:  Pneumonia Patient with likely bacterial pneumonia given improvement with antibiotics.  He will complete his Augmentin.  He has finished his azithromycin.  He will monitor for any worsening of symptoms  once antibiotics have been finished.  Discussed that it may take several weeks for him to get back to his baseline.  We will have him complete a follow-up chest x-ray around 1 month after his initial chest x-ray.  He will go to the medical mall at the hospital to have this completed.  Discussed if he has any worsening symptoms he needs to contact us.   No orders of the defined types were placed in this encounter.   No orders of the defined types were placed in this encounter.    I discussed the assessment and treatment plan with the patient. The patient was provided an opportunity to ask questions and all were answered. The patient agreed with the plan and demonstrated an understanding of the instructions.   The patient was advised to call back or seek an in-person evaluation if the symptoms worsen or if the condition fails to improve as anticipated.    Tommi Rumps, MD

## 2019-06-21 ENCOUNTER — Ambulatory Visit
Admission: RE | Admit: 2019-06-21 | Discharge: 2019-06-21 | Disposition: A | Discharge status: Home / Self Care | Payer: Managed Care, Other (non HMO) | Source: Ambulatory Visit | Attending: Family Medicine | Admitting: Family Medicine

## 2019-06-21 ENCOUNTER — Telehealth: Payer: Self-pay

## 2019-06-21 DIAGNOSIS — J189 Pneumonia, unspecified organism: Secondary | ICD-10-CM

## 2019-06-21 NOTE — Telephone Encounter (Signed)
Co-signed.

## 2019-06-23 ENCOUNTER — Other Ambulatory Visit: Payer: Self-pay | Admitting: Family Medicine

## 2019-06-23 DIAGNOSIS — J189 Pneumonia, unspecified organism: Secondary | ICD-10-CM

## 2019-07-17 ENCOUNTER — Ambulatory Visit
Admission: RE | Admit: 2019-07-17 | Discharge: 2019-07-17 | Disposition: A | Discharge status: Home / Self Care | Payer: Managed Care, Other (non HMO) | Source: Ambulatory Visit | Attending: Family Medicine | Admitting: Family Medicine

## 2019-07-17 DIAGNOSIS — J189 Pneumonia, unspecified organism: Secondary | ICD-10-CM | POA: Insufficient documentation

## 2019-10-07 ENCOUNTER — Other Ambulatory Visit: Payer: Self-pay | Admitting: Physician Assistant

## 2019-10-07 DIAGNOSIS — M50123 Cervical disc disorder at C6-C7 level with radiculopathy: Secondary | ICD-10-CM

## 2019-10-22 ENCOUNTER — Ambulatory Visit
Admission: RE | Admit: 2019-10-22 | Discharge: 2019-10-22 | Disposition: A | Discharge status: Home / Self Care | Payer: Managed Care, Other (non HMO) | Source: Ambulatory Visit | Attending: Physician Assistant | Admitting: Physician Assistant

## 2019-10-22 ENCOUNTER — Other Ambulatory Visit: Payer: Self-pay

## 2019-10-22 DIAGNOSIS — M50123 Cervical disc disorder at C6-C7 level with radiculopathy: Secondary | ICD-10-CM | POA: Diagnosis present

## 2019-12-18 ENCOUNTER — Other Ambulatory Visit: Payer: Self-pay

## 2019-12-18 ENCOUNTER — Ambulatory Visit (INDEPENDENT_AMBULATORY_CARE_PROVIDER_SITE_OTHER): Payer: Managed Care, Other (non HMO) | Admitting: Dermatology

## 2019-12-18 DIAGNOSIS — D229 Melanocytic nevi, unspecified: Secondary | ICD-10-CM | POA: Diagnosis not present

## 2019-12-18 DIAGNOSIS — L578 Other skin changes due to chronic exposure to nonionizing radiation: Secondary | ICD-10-CM

## 2019-12-18 DIAGNOSIS — D1800 Hemangioma unspecified site: Secondary | ICD-10-CM

## 2019-12-18 DIAGNOSIS — L814 Other melanin hyperpigmentation: Secondary | ICD-10-CM

## 2019-12-18 DIAGNOSIS — D220 Melanocytic nevi of lip: Secondary | ICD-10-CM

## 2019-12-18 DIAGNOSIS — D225 Melanocytic nevi of trunk: Secondary | ICD-10-CM | POA: Diagnosis not present

## 2019-12-18 DIAGNOSIS — L821 Other seborrheic keratosis: Secondary | ICD-10-CM

## 2019-12-18 DIAGNOSIS — Z1283 Encounter for screening for malignant neoplasm of skin: Secondary | ICD-10-CM | POA: Diagnosis not present

## 2019-12-18 MED ORDER — MUPIROCIN 2 % EX OINT: 1.0000 "application " | TOPICAL_OINTMENT | Freq: Every day | CUTANEOUS | 0 refills | Status: DC

## 2019-12-18 NOTE — Progress Notes (Signed)
   Follow-Up Visit   Subjective  Cristian Fernandez is a 39 y.o. male who presents for the following: full body skin exam and skin cancer screening  Patient here today for full body skin exam and skin cancer screening and to follow up on some previously biopsied spots.Nothing new or changing today that patient is aware of.   The following portions of the chart were reviewed this encounter and updated as appropriate:  Tobacco  Allergies  Meds  Problems  Med Hx  Surg Hx  Fam Hx      Review of Systems:  No other skin or systemic complaints except as noted in HPI or Assessment and Plan.  Objective  Well appearing patient in no apparent distress; mood and affect are within normal limits.  A full examination was performed including scalp, head, eyes, ears, nose, lips, neck, chest, axillae, abdomen, back, buttocks, bilateral upper extremities, bilateral lower extremities, hands, feet, fingers, toes, fingernails, and toenails. All findings within normal limits unless otherwise noted below.  Objective  Left upper cutaneous lip: 0.8cm light pink thin papule  Right Mid Back: 0.5cm med brown thin papule with darker focus 12 o'clock to 3 o'clock  Low Mid Back: 0.5cm light brown thin papule with darker focus at 11 o'clock  Images       Assessment & Plan  Nevus (3) Right Mid Back; Low Mid Back; Left upper cutaneous lip  Benign-appearing.  Observation.  Call clinic for new or changing moles.  Recommend daily use of broad spectrum spf 30+ sunscreen to sun-exposed areas.   Start mupirocin daily as needed.   mupirocin ointment (BACTROBAN) 2 % - Left upper cutaneous lip   Lentigines - Scattered tan macules - Discussed due to sun exposure - Benign, observe - Call for any changes  Seborrheic Keratoses - Stuck-on, waxy, tan-brown papules and plaques  - Discussed benign etiology and prognosis. - Observe - Call for any changes  Melanocytic Nevi - Tan-brown and/or  pink-flesh-colored symmetric macules and papules - Benign appearing on exam today - Observation - Call clinic for new or changing moles - Recommend daily use of broad spectrum spf 30+ sunscreen to sun-exposed areas.   Hemangiomas - Red papules - Discussed benign nature - Observe - Call for any changes  Actinic Damage - diffuse scaly erythematous macules with underlying dyspigmentation - Recommend daily broad spectrum sunscreen SPF 30+ to sun-exposed areas, reapply every 2 hours as needed.  - Call for new or changing lesions.  Skin cancer screening performed today.   Return in about 1 year (around 12/17/2020) for TBSE, 3 month recheck nevi.  Graciella Belton, RMA, am acting as scribe for Forest Gleason, MD .  Documentation: I have reviewed the above documentation for accuracy and completeness, and I agree with the above.  Forest Gleason, MD

## 2019-12-18 NOTE — Patient Instructions (Addendum)
Melanoma ABCDEs  Melanoma is the most dangerous type of skin cancer, and is the leading cause of death from skin disease.  You are more likely to develop melanoma if you:  Have light-colored skin, light-colored eyes, or red or blond hair  Spend a lot of time in the sun  Tan regularly, either outdoors or in a tanning bed  Have had blistering sunburns, especially during childhood  Have a close family member who has had a melanoma  Have atypical moles or large birthmarks  Early detection of melanoma is key since treatment is typically straightforward and cure rates are extremely high if we catch it early.   The first sign of melanoma is often a change in a mole or a new dark spot.  The ABCDE system is a way of remembering the signs of melanoma.  A for asymmetry:  The two halves do not match. B for border:  The edges of the growth are irregular. C for color:  A mixture of colors are present instead of an even brown color. D for diameter:  Melanomas are usually (but not always) greater than 6mm - the size of a pencil eraser. E for evolution:  The spot keeps changing in size, shape, and color.  Please check your skin once per month between visits. You can use a small mirror in front and a large mirror behind you to keep an eye on the back side or your body.   If you see any new or changing lesions before your next follow-up, please call to schedule a visit.  Please continue daily skin protection including broad spectrum sunscreen SPF 30+ to sun-exposed areas, reapplying every 2 hours as needed when you're outdoors.    Recommend taking Heliocare sun protection supplement daily in sunny weather for additional sun protection. For maximum protection on the sunniest days, you can take up to 2 capsules of regular Heliocare OR take 1 capsule of Heliocare Ultra. For prolonged exposure (such as a full day in the sun), you can repeat your dose of the supplement 4 hours after your first dose.  Heliocare can be purchased at [facility] or at [email or web address].   

## 2019-12-24 ENCOUNTER — Encounter: Payer: Self-pay | Admitting: Dermatology

## 2020-01-10 DIAGNOSIS — M5412 Radiculopathy, cervical region: Secondary | ICD-10-CM | POA: Insufficient documentation

## 2020-01-10 DIAGNOSIS — M542 Cervicalgia: Secondary | ICD-10-CM | POA: Insufficient documentation

## 2020-01-10 DIAGNOSIS — G8929 Other chronic pain: Secondary | ICD-10-CM | POA: Insufficient documentation

## 2020-01-10 DIAGNOSIS — M25511 Pain in right shoulder: Secondary | ICD-10-CM | POA: Insufficient documentation

## 2020-01-10 DIAGNOSIS — G5621 Lesion of ulnar nerve, right upper limb: Secondary | ICD-10-CM | POA: Insufficient documentation

## 2020-01-10 DIAGNOSIS — M4802 Spinal stenosis, cervical region: Secondary | ICD-10-CM | POA: Insufficient documentation

## 2020-03-03 ENCOUNTER — Other Ambulatory Visit: Payer: Self-pay | Admitting: Orthopedic Surgery

## 2020-03-03 DIAGNOSIS — G8929 Other chronic pain: Secondary | ICD-10-CM

## 2020-03-03 DIAGNOSIS — R29898 Other symptoms and signs involving the musculoskeletal system: Secondary | ICD-10-CM

## 2020-03-03 DIAGNOSIS — M25511 Pain in right shoulder: Secondary | ICD-10-CM

## 2020-03-10 ENCOUNTER — Ambulatory Visit
Admission: RE | Admit: 2020-03-10 | Discharge: 2020-03-10 | Disposition: A | Discharge status: Home / Self Care | Payer: Managed Care, Other (non HMO) | Source: Ambulatory Visit | Attending: Orthopedic Surgery | Admitting: Orthopedic Surgery

## 2020-03-10 ENCOUNTER — Other Ambulatory Visit: Payer: Self-pay

## 2020-03-10 DIAGNOSIS — R29898 Other symptoms and signs involving the musculoskeletal system: Secondary | ICD-10-CM

## 2020-03-10 DIAGNOSIS — M75111 Incomplete rotator cuff tear or rupture of right shoulder, not specified as traumatic: Secondary | ICD-10-CM | POA: Insufficient documentation

## 2020-03-10 DIAGNOSIS — T1490XS Injury, unspecified, sequela: Secondary | ICD-10-CM | POA: Insufficient documentation

## 2020-03-10 DIAGNOSIS — X58XXXS Exposure to other specified factors, sequela: Secondary | ICD-10-CM | POA: Insufficient documentation

## 2020-03-10 DIAGNOSIS — G8929 Other chronic pain: Secondary | ICD-10-CM | POA: Insufficient documentation

## 2020-03-10 DIAGNOSIS — M25511 Pain in right shoulder: Secondary | ICD-10-CM | POA: Diagnosis present

## 2020-03-10 MED ORDER — LIDOCAINE HCL (PF) 1 % IJ SOLN
10.0000 mL | Freq: Once | INTRAMUSCULAR | Status: AC
  Administered 2020-03-10: 10 mL
  Filled 2020-03-10: qty 10

## 2020-03-10 MED ORDER — IOHEXOL 180 MG/ML  SOLN
10.0000 mL | Freq: Once | INTRAMUSCULAR | Status: AC | PRN
  Administered 2020-03-10: 10 mL

## 2020-03-10 MED ORDER — GADOBUTROL 1 MMOL/ML IV SOLN
0.0100 mL | Freq: Once | INTRAVENOUS | Status: AC | PRN
  Administered 2020-03-10: 0.01 mL

## 2020-03-10 MED ORDER — SODIUM CHLORIDE (PF) 0.9 % IJ SOLN
7.0000 mL | Freq: Once | INTRAMUSCULAR | Status: AC
  Administered 2020-03-10: 7 mL

## 2020-03-17 ENCOUNTER — Encounter: Payer: Self-pay | Admitting: Family Medicine

## 2020-03-17 ENCOUNTER — Ambulatory Visit (INDEPENDENT_AMBULATORY_CARE_PROVIDER_SITE_OTHER): Payer: Managed Care, Other (non HMO) | Admitting: Family Medicine

## 2020-03-17 ENCOUNTER — Other Ambulatory Visit: Payer: Self-pay

## 2020-03-17 VITALS — BP 90/60 | HR 77 | Temp 97.6°F | Ht 73.0 in | Wt 169.2 lb

## 2020-03-17 DIAGNOSIS — Z1329 Encounter for screening for other suspected endocrine disorder: Secondary | ICD-10-CM | POA: Diagnosis not present

## 2020-03-17 DIAGNOSIS — Z13220 Encounter for screening for lipoid disorders: Secondary | ICD-10-CM | POA: Diagnosis not present

## 2020-03-17 DIAGNOSIS — Z130 Encounter for screening for diseases of the blood and blood-forming organs and certain disorders involving the immune mechanism: Secondary | ICD-10-CM | POA: Diagnosis not present

## 2020-03-17 DIAGNOSIS — Z8042 Family history of malignant neoplasm of prostate: Secondary | ICD-10-CM

## 2020-03-17 DIAGNOSIS — Z0000 Encounter for general adult medical examination without abnormal findings: Secondary | ICD-10-CM

## 2020-03-17 LAB — COMPREHENSIVE METABOLIC PANEL
ALT: 30 U/L (ref 0–53)
AST: 24 U/L (ref 0–37)
Albumin: 4.7 g/dL (ref 3.5–5.2)
Alkaline Phosphatase: 58 U/L (ref 39–117)
BUN: 15 mg/dL (ref 6–23)
CO2: 31 mEq/L (ref 19–32)
Calcium: 9.3 mg/dL (ref 8.4–10.5)
Chloride: 104 mEq/L (ref 96–112)
Creatinine, Ser: 1.14 mg/dL (ref 0.40–1.50)
GFR: 79.81 mL/min (ref >60.00)
Glucose, Bld: 94 mg/dL (ref 70–99)
Potassium: 4.4 mEq/L (ref 3.5–5.1)
Sodium: 141 mEq/L (ref 135–145)
Total Bilirubin: 0.7 mg/dL (ref 0.2–1.2)
Total Protein: 6.8 g/dL (ref 6.0–8.3)

## 2020-03-17 LAB — LIPID PANEL
Cholesterol: 166 mg/dL (ref 0–200)
HDL: 55.6 mg/dL (ref >39.00)
LDL Cholesterol: 88 mg/dL (ref 0–99)
NonHDL: 109.99
Total CHOL/HDL Ratio: 3
Triglycerides: 108 mg/dL (ref 0.0–149.0)
VLDL: 21.6 mg/dL (ref 0.0–40.0)

## 2020-03-17 LAB — CBC
HCT: 43.3 % (ref 39.0–52.0)
Hemoglobin: 14.8 g/dL (ref 13.0–17.0)
MCHC: 34.3 g/dL (ref 30.0–36.0)
MCV: 88.1 fl (ref 78.0–100.0)
Platelets: 189 10*3/uL (ref 150.0–400.0)
RBC: 4.92 Mil/uL (ref 4.22–5.81)
RDW: 12.7 % (ref 11.5–15.5)
WBC: 5.2 10*3/uL (ref 4.0–10.5)

## 2020-03-17 LAB — PSA: PSA: 0.76 ng/mL (ref 0.10–4.00)

## 2020-03-17 LAB — TSH: TSH: 4.35 u[IU]/mL (ref 0.35–4.50)

## 2020-03-17 NOTE — Assessment & Plan Note (Signed)
CPE completed. Encouraged continued healthy diet and exercise. Patient will have a PSA for prostate cancer screening. Declines COVID and flu vaccines. Encouraged to get these when he is ready to do so. Declines hep C screening. Patient is low risk for hep C. Labs as outlined.

## 2020-03-17 NOTE — Patient Instructions (Signed)
Nice to see you. We will get labs today and contact you with the results.  

## 2020-03-17 NOTE — Progress Notes (Signed)
Cristian Rumps, MD Phone: [phone]  JUANCARLOS CRESCENZO is a 39 y.o. male who presents today for CPE.  Diet: healthy, not much junk, no soda or sweet tea Exercise: taekwondo 2-3x/week Colonoscopy: not indicated Prostate cancer screening: due Family history-  Prostate cancer: paternal grandfather  Colon cancer: no Vaccines-   Flu: declines  Tetanus: UTD - reports one in the past several years  COVID19: declines HIV screening: declined previously Hep C Screening: declines, low risk Tobacco use: no Alcohol use: occasional small amount of alcohol Illicit Drug use: no Dentist: yes Ophthalmology: yes   Active Ambulatory Problems    Diagnosis Date Noted  . Annual physical exam 06/06/2016  . Pneumonia 05/27/2019  . Family history of prostate cancer 03/17/2020   Resolved Ambulatory Problems    Diagnosis Date Noted  . ALLERGIC RHINITIS 09/29/2008  . FATIGUE 09/29/2008  . Allergic reaction 01/29/2014   Past Medical History:  Diagnosis Date  . Allergic rhinitis   . Knee injury     Family History  Problem Relation Age of Onset  . Diabetes Mother   . Benign prostatic hyperplasia Father   . Prostate cancer Paternal Grandfather     Social History   Socioeconomic History  . Marital status: Married    Spouse name: Not on file  . Number of children: 1  . Years of education: Not on file  . Highest education level: Not on file  Occupational History  . Occupation: desk job  Tobacco Use  . Smoking status: Former Smoker    Quit date: 2006    Years since quitting: 15.9  . Smokeless tobacco: Never Used  Vaping Use  . Vaping Use: Never used  Substance and Sexual Activity  . Alcohol use: Yes    Comment: wine sometimes   . Drug use: Never  . Sexual activity: Not on file  Other Topics Concern  . Not on file  Social History Narrative  . Not on file   Social Determinants of Health   Financial Resource Strain:   . Difficulty of Paying Living Expenses: Not on file    Food Insecurity:   . Worried About Charity fundraiser in the Last Year: Not on file  . Ran Out of Food in the Last Year: Not on file  Transportation Needs:   . Lack of Transportation (Medical): Not on file  . Lack of Transportation (Non-Medical): Not on file  Physical Activity:   . Days of Exercise per Week: Not on file  . Minutes of Exercise per Session: Not on file  Stress:   . Feeling of Stress : Not on file  Social Connections:   . Frequency of Communication with Friends and Family: Not on file  . Frequency of Social Gatherings with Friends and Family: Not on file  . Attends Religious Services: Not on file  . Active Member of Clubs or Organizations: Not on file  . Attends Archivist Meetings: Not on file  . Marital Status: Not on file  Intimate Partner Violence:   . Fear of Current or Ex-Partner: Not on file  . Emotionally Abused: Not on file  . Physically Abused: Not on file  . Sexually Abused: Not on file    ROS  General:  Negative for nexplained weight loss, fever Skin: Negative for new or changing mole, sore that won't heal HEENT: Negative for trouble hearing, trouble seeing, ringing in ears, mouth sores, hoarseness, change in voice, dysphagia. CV:  Negative for chest pain, dyspnea, edema,  palpitations Resp: Negative for cough, dyspnea, hemoptysis GI: Negative for nausea, vomiting, diarrhea, constipation, abdominal pain, melena, hematochezia. GU: Negative for dysuria, incontinence, urinary hesitance, hematuria, vaginal or penile discharge, polyuria, sexual difficulty, lumps in testicle or breasts MSK: Negative for muscle cramps or aches, joint pain or swelling Neuro: Negative for headaches, weakness, numbness, dizziness, passing out/fainting Psych: Negative for depression, anxiety, memory problems  Objective  Physical Exam Vitals:   03/17/20 0848  BP: 90/60  Pulse: 77  Temp: 97.6 F (36.4 C)  SpO2: 99%    BP Readings from Last 3 Encounters:   03/17/20 90/60  05/20/19 (!) 98/58  05/14/19 99/71   Wt Readings from Last 3 Encounters:  03/17/20 169 lb 3.2 oz (76.7 kg)  05/27/19 161 lb (73 kg)  05/20/19 162 lb (73.5 kg)    Physical Exam Constitutional:      General: He is not in acute distress.    Appearance: He is not diaphoretic.  HENT:     Head: Normocephalic and atraumatic.  Eyes:     Conjunctiva/sclera: Conjunctivae normal.     Pupils: Pupils are equal, round, and reactive to light.  Cardiovascular:     Rate and Rhythm: Normal rate and regular rhythm.     Heart sounds: Normal heart sounds.  Pulmonary:     Effort: Pulmonary effort is normal.     Breath sounds: Normal breath sounds.  Abdominal:     General: Bowel sounds are normal. There is no distension.     Palpations: Abdomen is soft.     Tenderness: There is no abdominal tenderness. There is no guarding or rebound.  Musculoskeletal:     Right lower leg: No edema.     Left lower leg: No edema.  Lymphadenopathy:     Cervical: No cervical adenopathy.  Skin:    General: Skin is warm and dry.  Neurological:     Mental Status: He is alert.  Psychiatric:        Mood and Affect: Mood normal.      Assessment/Plan:   Problem List Items Addressed This Visit    Annual physical exam - Primary    CPE completed. Encouraged continued healthy diet and exercise. Patient will have a PSA for prostate cancer screening. Declines COVID and flu vaccines. Encouraged to get these when he is ready to do so. Declines hep C screening. Patient is low risk for hep C. Labs as outlined.       Family history of prostate cancer   Relevant Orders   PSA    Other Visit Diagnoses    Lipid screening       Relevant Orders   Comp Met (CMET)   Lipid panel   Screening for deficiency anemia       Relevant Orders   CBC   Thyroid disorder screen       Relevant Orders   TSH      This visit occurred during the SARS-CoV-2 public health emergency.  Safety protocols were in place,  including screening questions prior to the visit, additional usage of staff PPE, and extensive cleaning of exam room while observing appropriate contact time as indicated for disinfecting solutions.    Cristian Rumps, MD [place]

## 2020-03-25 ENCOUNTER — Other Ambulatory Visit: Payer: Self-pay

## 2020-03-25 ENCOUNTER — Ambulatory Visit (INDEPENDENT_AMBULATORY_CARE_PROVIDER_SITE_OTHER): Payer: Managed Care, Other (non HMO) | Admitting: Dermatology

## 2020-03-25 DIAGNOSIS — D229 Melanocytic nevi, unspecified: Secondary | ICD-10-CM

## 2020-03-25 DIAGNOSIS — D220 Melanocytic nevi of lip: Secondary | ICD-10-CM | POA: Diagnosis not present

## 2020-03-25 DIAGNOSIS — L578 Other skin changes due to chronic exposure to nonionizing radiation: Secondary | ICD-10-CM | POA: Diagnosis not present

## 2020-03-25 DIAGNOSIS — D225 Melanocytic nevi of trunk: Secondary | ICD-10-CM | POA: Diagnosis not present

## 2020-03-25 NOTE — Progress Notes (Signed)
   Follow-Up Visit   Subjective  Cristian Fernandez is a 39 y.o. male who presents for the following: Follow-up (3 mo recheck nevi at Left upper cutaneous lip; Right Mid Back; Low Mid Back).   The following portions of the chart were reviewed this encounter and updated as appropriate:  Tobacco  Allergies  Meds  Problems  Med Hx  Surg Hx  Fam Hx      Review of Systems: No other skin or systemic complaints except as noted in HPI or Assessment and Plan.   Objective  Well appearing patient in no apparent distress; mood and affect are within normal limits.  A focused examination was performed including back and face. Relevant physical exam findings are noted in the Assessment and Plan.  Objective  Left upper cutaneous lip, Low Mid Back, Right Mid Back: Left upper cutaneous lip: 0.8cm light pink thin papule   Right Mid Back: 0.5cm med brown thin papule with darker focus 12 o'clock to 3 o'clock   Low Mid Back: 0.5cm light brown thin papule with darker focus at 11 o'clock  Assessment & Plan  Nevus (3) Left upper cutaneous lip; Right Mid Back; Low Mid Back  Benign-appearing.  Observation.  Call clinic for new or changing moles.  Recommend daily use of broad spectrum spf 30+ sunscreen to sun-exposed areas.    Other Related Medications mupirocin ointment (BACTROBAN) 2 %  Actinic Damage - chronic, secondary to cumulative UV radiation exposure/sun exposure over time - diffuse scaly erythematous macules with underlying dyspigmentation - Recommend daily broad spectrum sunscreen SPF 30+ to sun-exposed areas, reapply every 2 hours as needed.  - Call for new or changing lesions.  Return in about 1 year (around 03/25/2021) for TBSE.   I, Harriett Sine, CMA, am acting as scribe for Forest Gleason, MD.  Documentation: I have reviewed the above documentation for accuracy and completeness, and I agree with the above.  Forest Gleason, MD

## 2020-04-02 ENCOUNTER — Other Ambulatory Visit: Payer: Self-pay | Admitting: Orthopedic Surgery

## 2020-04-07 ENCOUNTER — Other Ambulatory Visit: Payer: Self-pay

## 2020-04-07 ENCOUNTER — Other Ambulatory Visit: Admission: RE | Admit: 2020-04-07 | Payer: Managed Care, Other (non HMO) | Source: Ambulatory Visit

## 2020-04-07 ENCOUNTER — Other Ambulatory Visit
Admission: RE | Admit: 2020-04-07 | Discharge: 2020-04-07 | Disposition: A | Discharge status: Home / Self Care | Payer: Managed Care, Other (non HMO) | Source: Ambulatory Visit | Attending: Orthopedic Surgery | Admitting: Orthopedic Surgery

## 2020-04-07 HISTORY — DX: Adverse effect of unspecified anesthetic, initial encounter: T41.45XA

## 2020-04-07 NOTE — Patient Instructions (Signed)
Your procedure is scheduled on: Thursday April 09, 2020.  Report to Day Surgery inside [facility] 2nd floor (stop by registration desk first). To find out your arrival time please call [phone] between 1PM - 3PM on Wednesday April 08, 2020.  Remember: Instructions that are not followed completely may result in serious medical risk,  up to and including death, or upon the discretion of your surgeon and anesthesiologist your  surgery may need to be rescheduled.     _X__ 1. Do not eat food after midnight the night before your procedure.                 No chewing gum or hard candies. You may drink clear liquids up to 2 hours                 before you are scheduled to arrive for your surgery- DO not drink clear                 liquids within 2 hours of the start of your surgery.                 Clear Liquids include:  water, apple juice without pulp, clear Gatorade, G2 or                  Gatorade Zero (avoid Red/Purple/Blue), Black Coffee or Tea (Do not add                 anything to coffee or tea).  __X__2. Complete the "Ensure Clear Pre-surgery Clear Carbohydrate Drink" provided to you, 2 hours before arrival. **If you are diabetic you will be provided with an alternative drink, Gatorade Zero or G2.  __X__3 .  On the morning of surgery brush your teeth with toothpaste and water, you                may rinse your mouth with mouthwash if you wish.  Do not swallow any toothpaste of mouthwash.     _X__ 4.  No Alcohol for 24 hours before or after surgery.   _X__ 5.  Do Not Smoke or use e-cigarettes For 24 Hours Prior to Your Surgery.                 Do not use any chewable tobacco products for at least 6 hours prior to                 Surgery.  _X__  6.  Do not use any recreational drugs (marijuana, cocaine, heroin, ecstasy, MDMA or other)                For at least one week prior to your surgery.  Combination of these drugs with anesthesia                 May have life threatening results.  __X__7.  Notify your doctor if there is any change in your medical condition      (cold, fever, infections).     Do not wear jewelry, make-up, hairpins, clips or nail polish. Do not wear lotions, powders, or perfumes. You may wear deodorant. Do not shave 48 hours prior to surgery. Men may shave face and neck. Do not bring valuables to the hospital.    [facility] is not responsible for any belongings or valuables.  Contacts, dentures or bridgework may not be worn into surgery. Leave your suitcase in the car. After surgery it may be brought to  your room. For patients admitted to the hospital, discharge time is determined by your treatment team.   Patients discharged the day of surgery will not be allowed to drive home.   Make arrangements for someone to be with you for the first 24 hours of your Same Day Discharge.   _X___ Take these medicines the morning of surgery with A SIP OF WATER:    1. None   ____ Fleet Enema (as directed)   __X__ Use CHG Soap (or wipes) as directed  ____ Use Benzoyl Peroxide Gel as instructed  ____ Use inhalers on the day of surgery  ____ Stop metformin 2 days prior to surgery    ____ Take 1/2 of usual insulin dose the night before surgery. No insulin the morning          of surgery.   ____ Stop Coumadin/Plavix/aspirin   __X__ Stop Anti-inflammatories such as Ibuprofen, Aleve, Advil, naproxen, aspirin and or BC powders.    __X__ Stop supplements until after surgery.    __X__ Do not start any herbal supplements before your procedure.     If you have any questions regarding your pre-procedure instructions,  Please call Pre-admit Testing at [phone].

## 2020-04-08 ENCOUNTER — Other Ambulatory Visit
Admission: RE | Admit: 2020-04-08 | Discharge: 2020-04-08 | Disposition: A | Discharge status: Home / Self Care | Payer: Managed Care, Other (non HMO) | Source: Ambulatory Visit | Attending: Orthopedic Surgery | Admitting: Orthopedic Surgery

## 2020-04-08 DIAGNOSIS — Z01812 Encounter for preprocedural laboratory examination: Secondary | ICD-10-CM | POA: Diagnosis not present

## 2020-04-08 DIAGNOSIS — Z20822 Contact with and (suspected) exposure to covid-19: Secondary | ICD-10-CM | POA: Diagnosis not present

## 2020-04-08 LAB — SARS CORONAVIRUS 2 (TAT 6-24 HRS): SARS Coronavirus 2: NEGATIVE

## 2020-04-09 ENCOUNTER — Encounter: Payer: Self-pay | Admitting: Orthopedic Surgery

## 2020-04-09 ENCOUNTER — Ambulatory Visit: Payer: Managed Care, Other (non HMO) | Admitting: Anesthesiology

## 2020-04-09 ENCOUNTER — Ambulatory Visit
Admission: RE | Admit: 2020-04-09 | Discharge: 2020-04-09 | Disposition: A | Discharge status: Home / Self Care | Payer: Managed Care, Other (non HMO) | Attending: Orthopedic Surgery | Admitting: Orthopedic Surgery

## 2020-04-09 ENCOUNTER — Encounter
Admission: RE | Disposition: A | Discharge status: Home / Self Care | Payer: Self-pay | Source: Home / Self Care | Attending: Orthopedic Surgery

## 2020-04-09 ENCOUNTER — Other Ambulatory Visit: Payer: Self-pay

## 2020-04-09 ENCOUNTER — Ambulatory Visit: Payer: Managed Care, Other (non HMO)

## 2020-04-09 DIAGNOSIS — X58XXXA Exposure to other specified factors, initial encounter: Secondary | ICD-10-CM | POA: Insufficient documentation

## 2020-04-09 DIAGNOSIS — Z419 Encounter for procedure for purposes other than remedying health state, unspecified: Secondary | ICD-10-CM

## 2020-04-09 DIAGNOSIS — S46011A Strain of muscle(s) and tendon(s) of the rotator cuff of right shoulder, initial encounter: Secondary | ICD-10-CM | POA: Insufficient documentation

## 2020-04-09 DIAGNOSIS — Z87891 Personal history of nicotine dependence: Secondary | ICD-10-CM | POA: Diagnosis not present

## 2020-04-09 DIAGNOSIS — S43431A Superior glenoid labrum lesion of right shoulder, initial encounter: Secondary | ICD-10-CM | POA: Insufficient documentation

## 2020-04-09 HISTORY — PX: SHOULDER ARTHROSCOPY WITH LABRAL REPAIR: SHX5691

## 2020-04-09 SURGERY — ARTHROSCOPY, SHOULDER, WITH GLENOID LABRUM REPAIR
Anesthesia: General | Site: Shoulder | Laterality: Right

## 2020-04-09 MED ORDER — ONDANSETRON HCL 4 MG/2ML IJ SOLN
INTRAMUSCULAR | Status: AC
  Administered 2020-04-09: 17:00:00 4 mg via INTRAVENOUS
  Filled 2020-04-09: qty 2

## 2020-04-09 MED ORDER — ACETAMINOPHEN 500 MG PO TABS: 1000.0000 mg | ORAL_TABLET | Freq: Three times a day (TID) | ORAL | 2 refills | Status: DC

## 2020-04-09 MED ORDER — LIDOCAINE HCL (PF) 1 % IJ SOLN
INTRAMUSCULAR | Status: DC | PRN
  Administered 2020-04-09: 3 mL via SUBCUTANEOUS

## 2020-04-09 MED ORDER — OXYCODONE HCL 5 MG PO TABS: 5.0000 mg | ORAL_TABLET | ORAL | 0 refills | Status: DC | PRN

## 2020-04-09 MED ORDER — ROCURONIUM BROMIDE 10 MG/ML (PF) SYRINGE
PREFILLED_SYRINGE | INTRAVENOUS | Status: AC
  Filled 2020-04-09: qty 10

## 2020-04-09 MED ORDER — ASPIRIN EC 325 MG PO TBEC: 325.0000 mg | DELAYED_RELEASE_TABLET | Freq: Every day | ORAL | 0 refills | Status: DC

## 2020-04-09 MED ORDER — CEFAZOLIN SODIUM-DEXTROSE 2-4 GM/100ML-% IV SOLN
INTRAVENOUS | Status: AC
  Filled 2020-04-09: qty 100

## 2020-04-09 MED ORDER — FENTANYL CITRATE (PF) 250 MCG/5ML IJ SOLN
INTRAMUSCULAR | Status: AC
  Filled 2020-04-09: qty 5

## 2020-04-09 MED ORDER — DEXAMETHASONE SODIUM PHOSPHATE 10 MG/ML IJ SOLN
INTRAMUSCULAR | Status: DC | PRN
  Administered 2020-04-09: 10 mg via INTRAVENOUS

## 2020-04-09 MED ORDER — MIDAZOLAM HCL 2 MG/2ML IJ SOLN: 1.0000 mg | Freq: Once | INTRAMUSCULAR | Status: AC

## 2020-04-09 MED ORDER — PROPOFOL 10 MG/ML IV BOLUS
INTRAVENOUS | Status: DC | PRN
  Administered 2020-04-09: 200 mg via INTRAVENOUS

## 2020-04-09 MED ORDER — BUPIVACAINE LIPOSOME 1.3 % IJ SUSP
INTRAMUSCULAR | Status: DC | PRN
  Administered 2020-04-09: 20 mL via PERINEURAL

## 2020-04-09 MED ORDER — ONDANSETRON 4 MG PO TBDP: 4.0000 mg | ORAL_TABLET | Freq: Three times a day (TID) | ORAL | 0 refills | Status: DC | PRN

## 2020-04-09 MED ORDER — FENTANYL CITRATE (PF) 100 MCG/2ML IJ SOLN
INTRAMUSCULAR | Status: DC | PRN
  Administered 2020-04-09: 100 ug via INTRAVENOUS
  Administered 2020-04-09: 50 ug via INTRAVENOUS
  Administered 2020-04-09: 100 ug via INTRAVENOUS

## 2020-04-09 MED ORDER — PHENYLEPHRINE HCL (PRESSORS) 10 MG/ML IV SOLN
INTRAVENOUS | Status: AC
  Filled 2020-04-09: qty 1

## 2020-04-09 MED ORDER — CHLORHEXIDINE GLUCONATE 0.12 % MT SOLN
OROMUCOSAL | Status: AC
  Filled 2020-04-09: qty 15

## 2020-04-09 MED ORDER — DEXMEDETOMIDINE HCL IN NACL 400 MCG/100ML IV SOLN
INTRAVENOUS | Status: DC | PRN
  Administered 2020-04-09: 20 ug via INTRAVENOUS

## 2020-04-09 MED ORDER — FENTANYL CITRATE (PF) 100 MCG/2ML IJ SOLN: 100.0000 ug | Freq: Once | INTRAMUSCULAR | Status: AC

## 2020-04-09 MED ORDER — CHLORHEXIDINE GLUCONATE 0.12 % MT SOLN: 15.0000 mL | Freq: Once | OROMUCOSAL | Status: AC

## 2020-04-09 MED ORDER — BUPIVACAINE LIPOSOME 1.3 % IJ SUSP
INTRAMUSCULAR | Status: AC
  Filled 2020-04-09: qty 20

## 2020-04-09 MED ORDER — ORAL CARE MOUTH RINSE
15.0000 mL | Freq: Once | OROMUCOSAL | Status: AC
  Administered 2020-04-09: 14:00:00 15 mL via OROMUCOSAL

## 2020-04-09 MED ORDER — LIDOCAINE HCL (CARDIAC) PF 100 MG/5ML IV SOSY
PREFILLED_SYRINGE | INTRAVENOUS | Status: DC | PRN
  Administered 2020-04-09: 100 mg via INTRAVENOUS

## 2020-04-09 MED ORDER — LACTATED RINGERS IV SOLN
INTRAVENOUS | Status: DC | PRN
  Administered 2020-04-09: 15:00:00 12000 mL

## 2020-04-09 MED ORDER — LIDOCAINE HCL (PF) 1 % IJ SOLN
INTRAMUSCULAR | Status: AC
  Filled 2020-04-09: qty 5

## 2020-04-09 MED ORDER — ONDANSETRON HCL 4 MG/2ML IJ SOLN
INTRAMUSCULAR | Status: AC
  Filled 2020-04-09: qty 2

## 2020-04-09 MED ORDER — SUGAMMADEX SODIUM 200 MG/2ML IV SOLN
INTRAVENOUS | Status: DC | PRN
  Administered 2020-04-09: 200 mg via INTRAVENOUS

## 2020-04-09 MED ORDER — FENTANYL CITRATE (PF) 100 MCG/2ML IJ SOLN: 25.0000 ug | INTRAMUSCULAR | Status: DC | PRN

## 2020-04-09 MED ORDER — PROPOFOL 10 MG/ML IV BOLUS
INTRAVENOUS | Status: AC
  Filled 2020-04-09: qty 20

## 2020-04-09 MED ORDER — MIDAZOLAM HCL 2 MG/2ML IJ SOLN
INTRAMUSCULAR | Status: AC
  Administered 2020-04-09: 14:00:00 2 mg via INTRAVENOUS
  Filled 2020-04-09: qty 2

## 2020-04-09 MED ORDER — EPINEPHRINE PF 1 MG/ML IJ SOLN
INTRAMUSCULAR | Status: AC
  Filled 2020-04-09: qty 2

## 2020-04-09 MED ORDER — FENTANYL CITRATE (PF) 100 MCG/2ML IJ SOLN
INTRAMUSCULAR | Status: AC
  Administered 2020-04-09: 14:00:00 50 ug via INTRAVENOUS
  Filled 2020-04-09: qty 2

## 2020-04-09 MED ORDER — ASPIRIN EC 325 MG PO TBEC: 325.0000 mg | DELAYED_RELEASE_TABLET | Freq: Every day | ORAL | 0 refills | Status: AC

## 2020-04-09 MED ORDER — BUPIVACAINE HCL (PF) 0.5 % IJ SOLN
INTRAMUSCULAR | Status: AC
  Filled 2020-04-09: qty 10

## 2020-04-09 MED ORDER — FAMOTIDINE 20 MG PO TABS
ORAL_TABLET | ORAL | Status: AC
  Administered 2020-04-09: 14:00:00 20 mg via ORAL
  Filled 2020-04-09: qty 1

## 2020-04-09 MED ORDER — CEFAZOLIN SODIUM-DEXTROSE 2-4 GM/100ML-% IV SOLN
2.0000 g | INTRAVENOUS | Status: AC
  Administered 2020-04-09: 14:00:00 2 g via INTRAVENOUS

## 2020-04-09 MED ORDER — ROCURONIUM BROMIDE 100 MG/10ML IV SOLN
INTRAVENOUS | Status: DC | PRN
  Administered 2020-04-09: 100 mg via INTRAVENOUS

## 2020-04-09 MED ORDER — LIDOCAINE HCL (PF) 2 % IJ SOLN
INTRAMUSCULAR | Status: AC
  Filled 2020-04-09: qty 5

## 2020-04-09 MED ORDER — DEXAMETHASONE SODIUM PHOSPHATE 10 MG/ML IJ SOLN
INTRAMUSCULAR | Status: AC
  Filled 2020-04-09: qty 1

## 2020-04-09 MED ORDER — ONDANSETRON HCL 4 MG/2ML IJ SOLN: 4.0000 mg | Freq: Once | INTRAMUSCULAR | Status: AC | PRN

## 2020-04-09 MED ORDER — BUPIVACAINE HCL (PF) 0.5 % IJ SOLN
INTRAMUSCULAR | Status: DC | PRN
  Administered 2020-04-09: 10 mL via PERINEURAL

## 2020-04-09 MED ORDER — LACTATED RINGERS IV SOLN: INTRAVENOUS | Status: DC

## 2020-04-09 MED ORDER — ONDANSETRON HCL 4 MG/2ML IJ SOLN
INTRAMUSCULAR | Status: DC | PRN
  Administered 2020-04-09: 4 mg via INTRAVENOUS

## 2020-04-09 MED ORDER — FAMOTIDINE 20 MG PO TABS: 20.0000 mg | ORAL_TABLET | Freq: Once | ORAL | Status: AC

## 2020-04-09 SURGICAL SUPPLY — 67 items
ADAPTER IRRIG TUBE 2 SPIKE SOL (ADAPTER) ×6 IMPLANT
ADPR TBG 2 SPK PMP STRL ASCP (ADAPTER) ×2
ANCH SUT SHRT 12.5 CANN EYLT (Anchor) ×1 IMPLANT
ANCHOR 3.9 PEEK 3 CORKSCREW (Anchor) ×2 IMPLANT
ANCHOR SUT 1.8 FBRTK KNTLS 2SU (Anchor) ×6 IMPLANT
ANCHOR SUT BIOCOMP LK 2.9X12.5 (Anchor) ×2 IMPLANT
APL PRP STRL LF DISP 70% ISPRP (MISCELLANEOUS) ×1
BUR RADIUS 4.0X18.5 (BURR) ×3 IMPLANT
BUR RADIUS 5.5 (BURR) ×3 IMPLANT
CANNULA 5.75X7CM (CANNULA) ×2
CANNULA PART THRD DISP 5.75X7 (CANNULA) ×4 IMPLANT
CANNULA PARTIAL THREAD 2X7 (CANNULA) ×3 IMPLANT
CANNULA TWIST IN 8.25X9CM (CANNULA) ×2 IMPLANT
CHLORAPREP W/TINT 26 (MISCELLANEOUS) ×3 IMPLANT
COOLER POLAR GLACIER W/PUMP (MISCELLANEOUS) ×3 IMPLANT
DRAPE 3/4 80X56 (DRAPES) ×3 IMPLANT
DRAPE IMP U-DRAPE 54X76 (DRAPES) ×6 IMPLANT
DRAPE INCISE IOBAN 66X45 STRL (DRAPES) ×3 IMPLANT
DRAPE U-SHAPE 47X51 STRL (DRAPES) ×3 IMPLANT
DRSG TEGADERM 4X4.75 (GAUZE/BANDAGES/DRESSINGS) ×8 IMPLANT
ELECT REM PT RETURN 9FT ADLT (ELECTROSURGICAL) ×3
ELECTRODE REM PT RTRN 9FT ADLT (ELECTROSURGICAL) IMPLANT
GAUZE 4X4 16PLY RFD (DISPOSABLE) ×3 IMPLANT
GAUZE SPONGE 4X4 12PLY STRL (GAUZE/BANDAGES/DRESSINGS) ×3 IMPLANT
GAUZE XEROFORM 1X8 LF (GAUZE/BANDAGES/DRESSINGS) ×3 IMPLANT
GLOVE BIOGEL PI IND STRL 8 (GLOVE) ×1 IMPLANT
GLOVE BIOGEL PI INDICATOR 8 (GLOVE) ×2
GLOVE SURG ORTHO 8.0 STRL STRW (GLOVE) ×6 IMPLANT
GLOVE SURG SYN 8.0 (GLOVE) ×3 IMPLANT
GLOVE SURG SYN 8.0 PF PI (GLOVE) ×1 IMPLANT
GOWN STRL REUS W/ TWL LRG LVL3 (GOWN DISPOSABLE) ×1 IMPLANT
GOWN STRL REUS W/TWL LRG LVL3 (GOWN DISPOSABLE) ×3
GOWN STRL REUS W/TWL XL LVL3 (GOWN DISPOSABLE) ×3 IMPLANT
IV LACTATED RINGER IRRG 3000ML (IV SOLUTION) ×24
IV LR IRRIG 3000ML ARTHROMATIC (IV SOLUTION) ×20 IMPLANT
KIT CORKSCREW KNTLS 3.9 S/T/P (INSTRUMENTS) ×2 IMPLANT
KIT INSERTION 2.9 PUSHLOCK (KITS) ×2 IMPLANT
KIT STABILIZATION SHOULDER (MISCELLANEOUS) ×3 IMPLANT
KIT STR SPEAR 1.8 FBRTK DISP (KITS) ×2 IMPLANT
KIT SUTURETAK 3.0 INSERT PERC (KITS) IMPLANT
KIT TURNOVER KIT A (KITS) ×3 IMPLANT
LASSO 25 DEG RIGHT QUICKPASS (SUTURE) ×2 IMPLANT
MANIFOLD NEPTUNE II (INSTRUMENTS) ×6 IMPLANT
MASK FACE SPIDER DISP (MASK) ×3 IMPLANT
MAT ABSORB  FLUID 56X50 GRAY (MISCELLANEOUS) ×4
MAT ABSORB FLUID 56X50 GRAY (MISCELLANEOUS) ×2 IMPLANT
PACK ARTHROSCOPY SHOULDER (MISCELLANEOUS) ×3 IMPLANT
PAD ABD DERMACEA PRESS 5X9 (GAUZE/BANDAGES/DRESSINGS) ×3 IMPLANT
PAD WRAPON POLAR SHDR XLG (MISCELLANEOUS) ×1 IMPLANT
SET TUBE SUCT SHAVER OUTFL 24K (TUBING) ×3 IMPLANT
SET TUBE TIP INTRA-ARTICULAR (MISCELLANEOUS) ×3 IMPLANT
SLING ULTRA II M (MISCELLANEOUS) IMPLANT
SUT ETHILON 3-0 FS-10 30 BLK (SUTURE) ×6
SUT ETHILON 4-0 (SUTURE) ×3
SUT ETHILON 4-0 FS2 18XMFL BLK (SUTURE) ×1
SUT LASSO 90 DEG SD STR (SUTURE) IMPLANT
SUT MNCRL 4-0 (SUTURE) ×3
SUT MNCRL 4-0 27XMFL (SUTURE) ×1
SUTURE EHLN 3-0 FS-10 30 BLK (SUTURE) IMPLANT
SUTURE ETHLN 4-0 FS2 18XMF BLK (SUTURE) ×1 IMPLANT
SUTURE MNCRL 4-0 27XMF (SUTURE) IMPLANT
TAPE MICROFOAM 4IN (TAPE) ×3 IMPLANT
TUBING ARTHRO INFLOW-ONLY STRL (TUBING) ×3 IMPLANT
TUBING CONNECTING 10 (TUBING) ×2 IMPLANT
TUBING CONNECTING 10' (TUBING) ×1
WAND WEREWOLF FLOW 90D (MISCELLANEOUS) ×2 IMPLANT
WRAPON POLAR PAD SHDR XLG (MISCELLANEOUS) ×3

## 2020-04-09 NOTE — Op Note (Signed)
DATE: 04/09/2020   PRE-OP DIAGNOSIS:  1. Right shoulder posterior labral tear 2. Right shoulder posterior instability 3. Right shoulder superior border subscapularis tear   POST-OP DIAGNOSIS:  1. Right shoulder posterior labral tear 2. Right shoulder posterior instability 3. Right shoulder superior border subscapularis tear   PROCEDURES:  1. Right shoulder arthroscopic posterior labral repair and capsulorraphy 2. Right shoulder arthroscopic rotator cuff repair (subscapularis) 3. Right shoulder arthroscopic extensive glenohumeral debridement   SURGEON: Cato Mulligan, MD  ASSISTANT: Anitra Lauth, PA   ANESTHESIA: Regional    TOTAL IV FLUIDS: per anesthesia record   ESTIMATED BLOOD LOSS: minimal    DRAINS: None    SPECIMENS: None.    IMPLANTS:  Arthrex Knotless 1.32mm FiberTak suture anchor x3 Arthrex Pushlock 2.46mm x 1 Arthrex Knotless 3.54mm Corkscrew x 1   COMPLICATIONS: None    Indications:  Cristian Fernandez is a 39 y.o. male with a history of more significant shoulder pain with clicking and catching sensations for approximately 8 months.  Clinical exam and MRI were suggestive of posterior labral tear with instability as well as a partial thickness subscapularis tear. The patient has failed non-operative management in the form of appropriate physical therapy, medications, activity modifications, and corticosteroid injection. After discussion of risks, benefits, and alternatives to surgery, the patient elected to proceed.   Procedure Details  The patient was seen in the Holding Room. The risks, benefits, complications, treatment options, and expected outcomes were discussed with the patient. The risks and potential complications of the problem and proposed treatment were discussed. This includes but is not limited to failure to fully relieve pain, continued or recurrence of pain, recurrent instability, infection, neurovascular compromise, complications from anesthesia, stiff  shoulder, bleeding, DVT, and reoperation. The patient concurred with the proposed plan, giving informed consent. The site of surgery was properly noted/marked.   The patient was placed on the OR bed in the beach chair position using a beanbag. All bony prominences were well padded. The patient was given appropriate preoperative IV antibiotics. The upper extremity was prescrubbed with alcohol, prepped with Chloroprep, and draped in the usual sterile fashion. A Time Out Procedure was performed confirming correct patient, procedure, and laterality.   Exam under anesthesia showed: laxity anterior 1+, posterior 2+. 170FF, 80 ER with arm at side; 120 ER with arm abducted   Glenohumeral portals were marked and injected with dilute epinephrine in lidocaine. An 11 blade was used to make stab incisions in the posterior soft spot for the standard posterior viewing portal. A standard low inferior anterior portal was made as a working portal. I made an additional high anterolateral portal, which was used as the anterior viewing portal. We performed a thorough arthroscopic evaluation of the glenohumeral joint through both the anterior and posterior viewing portals.   Glenohumeral Joint: Articular cartilage of the humeral head and glenoid: Normal Synovium: injected and erythematous Anterior Labrum: normal with small sublabral foramen Posterior Labrum: posterior labral tear extending from 7:00 to 10:00 Superior Labrum:  normal Inferior Labrum: normal Anterior Capsule: normal Inferior Capsule: normal  Posterior Capsule: patulous  Rotator interval: normal  Superior glenohumeral ligament: normal  Middle glenohumeral ligament: normal  Inferior glenohumeral ligament: normal. No HAGL. Biceps: normal Rotator cuff findings: normal supraspinatus, infraspinatus.  Upper border partial-thickness subscapularis tear  The subscapularis repair was performed first.  The lesser tuberosity footprint was prepared with a  combination of electrocautery and an arthroscopic curette.  An Arthrex knotless corkscrew was placed into the lesser  tuberosity footprint from the anterior portal.  A BirdBeak was passed through the subscapularis and used to retrieve the passing suture through from the anterior portal.  The suture was shuttled through the anchor.  The suture was tensioned appropriately, nicely reducing the torn upper border of the subscapularis to its footprint.  The arm was then internally and externally rotated and the subscapularis was noted to move appropriately with rotation.  The remainder of the suture was then cut.  The arthroscope was then placed in the anterolateral viewing portal. The elbow was placed in a position of slight anterior and inferior traction to allow for improved visualization. Next, a portal of [place] was made along the posterior 1/3 of the acromion just inferior to the lateral edge using a spinal needle to confirm appropriate positioning.  A 40mm cannula was placed in the portal of [place]. An elevator was used to elevate the labrum off the glenoid posteriorly in the affected regions described above. A rasp and shaver were used to roughen the glenoid for improvement of healing.  We began by placing the first knotless FiberTak at the 7:00 o'clock position using a curved guide.  Arthrex suture lasso was used to shuttle the stitch through the posteroinferior capsule and labrum adjacent to the anchor.  Appropriate passing mechanism was utilized and the suture was tensioned until the capsule and labrum were appropriately reduced with formation of an appropriate bumper.  The suture tail was cut flush with the articular cartilage. This process was repeated for anchors at the 8:00, 9:00, and 10:00 positions.  A push lock anchor with labral tape was utilized at the 10 o'clock position.  The repair was stable to probing, there was visible evidence of decreased joint space and capsular area with formation of  an appropriate inferior and posterior bumper. The shaver was used to debride any loose bony fragments from drilling as well as the previously visualized frayed cartilage edges on the humerus and glenoid.  Fluid was evacuated from the joint and the arthroscope was removed.   That concluded the case. The wounds were closed with 3-0 nylon. Xeroform gauze and sterile dressings were applied.  Polar Care was placed. Patient was placed in a neutral shoulder immobilizer to prevent internal rotation.  Patient was then awakened from anesthesia and transported to PACU without notable complication.     Of note, assistance from a PA was essential to performing the surgery.  PA was present for the entire surgery.  PA assisted with patient positioning, retraction, instrumentation, and wound closure. The surgery would have been more difficult and had longer operative time without PA assistance.   POSTOPERATIVE PLAN:  Patient will be discharged to home.  Physical therapy 3-4 days after surgery.  Return to the clinic 10-14 days postop for suture removal Maintain arm in immobilizer with neutral wedge. NWB.

## 2020-04-09 NOTE — Anesthesia Preprocedure Evaluation (Addendum)
Anesthesia Evaluation  Patient identified by MRN, date of birth, ID band Patient awake    Reviewed: Allergy & Precautions, H&P , NPO status , Patient's Chart, lab work & pertinent test results, reviewed documented beta blocker date and time   History of Anesthesia Complications (+) PROLONGED EMERGENCE and history of anesthetic complications  Airway Mallampati: II  TM Distance: >3 FB Neck ROM: full    Dental  (+) Teeth Intact   Pulmonary pneumonia, former smoker,    Pulmonary exam normal        Cardiovascular Exercise Tolerance: Poor negative cardio ROS Normal cardiovascular exam Rhythm:regular Rate:Normal     Neuro/Psych negative neurological ROS  negative psych ROS   GI/Hepatic negative GI ROS, Neg liver ROS,   Endo/Other  negative endocrine ROS  Renal/GU negative Renal ROS  negative genitourinary   Musculoskeletal   Abdominal   Peds  Hematology negative hematology ROS (+)   Anesthesia Other Findings Past Medical History: No date: Adverse effect of anesthesia     Comment:  was hard to wake up after wisdom teeht  No date: Allergic rhinitis No date: Knee injury     Comment:  R knee injury at 39 years old, recurrent effusions 04/2019: Pneumonia     Comment:  double pneumonia  Past Surgical History: No date: WISDOM TOOTH EXTRACTION   Reproductive/Obstetrics negative OB ROS                             Anesthesia Physical Anesthesia Plan  ASA: II  Anesthesia Plan: General ETT   Post-op Pain Management:  Regional for Post-op pain   Induction:   PONV Risk Score and Plan: 2  Airway Management Planned:   Additional Equipment:   Intra-op Plan:   Post-operative Plan:   Informed Consent: I have reviewed the patients History and Physical, chart, labs and discussed the procedure including the risks, benefits and alternatives for the proposed anesthesia with the patient or  authorized representative who has indicated his/her understanding and acceptance.     Dental Advisory Given  Plan Discussed with: CRNA  Anesthesia Plan Comments:        Anesthesia Quick Evaluation

## 2020-04-09 NOTE — Anesthesia Postprocedure Evaluation (Signed)
Anesthesia Post Note  Patient: Cristian Fernandez  Procedure(s) Performed: Right shoulder arthroscopic posterior labral repair with possible subpectoral biceps tenodesis - Reche Dixon to Assist (Right Shoulder)  Patient location during evaluation: PACU Anesthesia Type: General Level of consciousness: awake and alert Pain management: pain level controlled Vital Signs Assessment: post-procedure vital signs reviewed and stable Respiratory status: spontaneous breathing and respiratory function stable Cardiovascular status: stable Anesthetic complications: no   No complications documented.   Last Vitals:  Vitals:   04/09/20 1707 04/09/20 1734  BP: 108/76 111/62  Pulse: 72 79  Resp: 18   Temp: (!) 35.9 C   SpO2: 100% 99%    Last Pain:  Vitals:   04/09/20 1707  TempSrc: Temporal  PainSc: 0-No pain                 Malcom Selmer K

## 2020-04-09 NOTE — Anesthesia Procedure Notes (Signed)
Anesthesia Regional Block: Interscalene brachial plexus block   Pre-Anesthetic Checklist: ,, timeout performed, Correct Patient, Correct Site, Correct Laterality, Correct Procedure, Correct Position, site marked, Risks and benefits discussed,  Surgical consent,  Pre-op evaluation,  At surgeon's request and post-op pain management  Laterality: Right  Prep: chloraprep       Needles:  Injection technique: Single-shot  Needle Type: Echogenic Stimulator Needle     Needle Length: 10cm  Needle Gauge: 20     Additional Needles:   Procedures:, nerve stimulator,,, ultrasound used (permanent image in chart),,,,  Narrative:  Start time: 04/09/2020 1:56 PM End time: 04/09/2020 2:04 PM Injection made incrementally with aspirations every 5 mL.  Performed by: Personally   Additional Notes: Functioning IV was confirmed and monitors were applied.  . Sterile prep and drape,hand hygiene and sterile gloves were used.  Negative aspiration and negative test dose prior to incremental administration of local anesthetic. The patient tolerated the procedure well.

## 2020-04-09 NOTE — Transfer of Care (Signed)
Immediate Anesthesia Transfer of Care Note  Patient: Cristian Fernandez  Procedure(s) Performed: Right shoulder arthroscopic posterior labral repair with possible subpectoral biceps tenodesis - Reche Dixon to Assist (Right Shoulder)  Patient Location: PACU  Anesthesia Type:GA combined with regional for post-op pain  Level of Consciousness: sedated  Airway & Oxygen Therapy: Patient Spontanous Breathing and Patient connected to face mask oxygen  Post-op Assessment: Report given to RN and Post -op Vital signs reviewed and stable  Post vital signs: Reviewed and stable  Last Vitals:  Vitals Value Taken Time  BP 92/61 04/09/20 1624  Temp 36.1 C 04/09/20 1624  Pulse 80 04/09/20 1630  Resp 13 04/09/20 1630  SpO2 99 % 04/09/20 1630  Vitals shown include unvalidated device data.  Last Pain:  Vitals:   04/09/20 1322  TempSrc: Oral  PainSc: 6          Complications: No complications documented.

## 2020-04-09 NOTE — Anesthesia Procedure Notes (Signed)
Procedure Name: Intubation Performed by: Rieley Hausman R, CRNA Pre-anesthesia Checklist: Patient identified, Emergency Drugs available, Suction available and Patient being monitored Patient Re-evaluated:Patient Re-evaluated prior to induction Oxygen Delivery Method: Circle system utilized Preoxygenation: Pre-oxygenation with 100% oxygen Induction Type: IV induction Ventilation: Mask ventilation without difficulty and Oral airway inserted - appropriate to patient size Laryngoscope Size: Mac and 4 Grade View: Grade I Tube type: Oral Tube size: 8.0 mm Number of attempts: 1 Airway Equipment and Method: Oral airway Placement Confirmation: ETT inserted through vocal cords under direct vision,  positive ETCO2 and breath sounds checked- equal and bilateral Secured at: 24 cm Tube secured with: Tape Dental Injury: Teeth and Oropharynx as per pre-operative assessment        

## 2020-04-09 NOTE — H&P (Signed)
Paper H&P to be scanned into permanent record. H&P reviewed. No significant changes noted.  

## 2020-04-09 NOTE — Discharge Instructions (Addendum)
Post-Op Instructions  1. Bracing: You will wear a shoulder immobilizer or sling for at least 3 weeks. Total time will be determined by type of surgical repair and can be discussed at 1st postop appointment. Keep arm in a neutral or externally rotated position using the pillow with the sling. Do NOT rest forearm against belly -- forearm shoulder be resting in a position pointing in front of the body.   2. Driving: No driving for 3 weeks post-op. When driving, do not wear the immobilizer.  3. Activity: No active lifting for 2 months. Wrist, hand, and elbow motion only. Avoid lifting the upper arm away from the body except for hygiene. You are permitted to bend and straighten the elbow actively. You may use your hand and wrist for typing, writing, and managing utensils (cutting food). Do not lift more than a coffee cup for 8 weeks.  When sleeping or resting, inclined positions (recliner chair or wedge pillow) and a pillow under the forearm for support may provide better comfort for up to 4 weeks.  Avoid long distance travel for 4 weeks.  Return to all activities after labral repair normally takes 6 months on average. If rehab goes very well, may be able to do most activities at 4 months, except overhead or contact sports.  4. Physical Therapy: Begins 3-4 days after surgery, and proceeds 2 times per week for the first 4 weeks, then 1-2 times per week from weeks 4-8 post-op.  5. Medications:  - You will be provided a prescription for narcotic pain medicine. After surgery, take 1-2 narcotic tablets every 4 hours if needed for severe pain.  - A prescription for anti-nausea medication will be provided in case the narcotic medicine causes nausea - take 1 tablet every 6 hours only if nauseated.   - Take tylenol 1000 mg every 8 hours for pain.  May stop tylenol 3 days after surgery if you are having minimal pain. - Take ASA 325mg /day x 2 weeks to help prevent DVT/PE (blood clots).   If you are taking  prescription medication for anxiety, depression, insomnia, muscle spasm, chronic pain, or for attention deficit disorder, you are advised that you are at a higher risk of adverse effects with use of narcotics post-op, including narcotic addiction/dependence, depressed breathing, death. If you use non-prescribed substances: alcohol, marijuana, cocaine, heroin, methamphetamines, etc., you are at a higher risk of adverse effects with use of narcotics post-op, including narcotic addiction/dependence, depressed breathing, death. You are advised that taking > 50 morphine milligram equivalents (MME) of narcotic pain medication per day results in twice the risk of overdose or death. For your prescription provided: oxycodone 5 mg - taking more than 6 tablets per day would result in > 50 morphine milligram equivalents (MME) of narcotic pain medication. Be advised that we will prescribe narcotics short-term, for acute post-operative pain only - 3 weeks for major operations such as shoulder repair/reconstruction surgeries.   6. Post-Op Appointment:  Your first post-op appointment will be 10-14 days post-op.  7. Work or School: For most, but not all procedures, we advise staying out of work or school for at least 1 to 2 weeks in order to recover from the stress of surgery and to allow time for healing.   If you need a work or school note this can be provided.   Post-operative Brace: Apply and remove the brace you received as you were instructed to at the time of fitting and as described in detail as the brace's  instructions for use indicate.  Wear the brace for the period of time prescribed by your physician.  The brace can be cleaned with soap and water and allowed to air dry only.  Should the brace result in increased pain, decreased feeling (numbness/tingling), increased swelling or an overall worsening of your medical condition, please contact your doctor immediately.  If an emergency situation occurs as a  result of wearing the brace after normal business hours, please dial 911 and seek immediate medical attention.  Let your doctor know if you have any further questions about the brace issued to you. Refer to the shoulder sling instructions for use if you have any questions regarding the correct fit of your shoulder sling.  [facility]: [phone]  Video that illustrates how to properly use a shoulder sling: "Instructions for Proper Use of an Orthopaedic Sling" [email or web address]       Interscalene Nerve Block with Exparel  1.  For your surgery you have received an Interscalene Nerve Block with Exparel. 2. Nerve Blocks affect many types of nerves, including nerves that control movement, pain and normal sensation.  You may experience feelings such as numbness, tingling, heaviness, weakness or the inability to move your arm or the feeling or sensation that your arm has "fallen asleep". 3. A nerve block with Exparel can last up to 5 days.  Usually the weakness wears off first.  The tingling and heaviness usually wear off next.  Finally you may start to notice pain.  Keep in mind that this may occur in any order.  Once a nerve block starts to wear off it is usually completely gone within 60 minutes. 4. ISNB may cause mild shortness of breath, a hoarse voice, blurry vision, unequal pupils, or drooping of the face on the same side as the nerve block.  These symptoms will usually resolve with the numbness.  Very rarely the procedure itself can cause mild seizures. 5. If needed, your surgeon will give you a prescription for pain medication.  It will take about 60 minutes for the oral pain medication to become fully effective.  So, it is recommended that you start taking this medication before the nerve block first begins to wear off, or when you first begin to feel discomfort. 6. Take your pain medication only as prescribed.  Pain medication can cause  sedation and decrease your breathing if you take more than you need for the level of pain that you have. 7. Nausea is a common side effect of many pain medications.  You may want to eat something before taking your pain medicine to prevent nausea. 8. After an Interscalene nerve block, you cannot feel pain, pressure or extremes in temperature in the effected arm.  Because your arm is numb it is at an increased risk for injury.  To decrease the possibility of injury, please practice the following:  a. While you are awake change the position of your arm frequently to prevent too much pressure on any one area for prolonged periods of time. b.  If you have a cast or tight dressing, check the color or your fingers every couple of hours.  Call your surgeon with the appearance of any discoloration (white or blue). c. If you are given a sling to wear before you go home, please wear it  at all times until the block has completely worn off.  Do not get up at night without your sling. d. Please contact [facility] or your surgeon if  you do not begin to regain sensation after 7 days from the surgery.  Anesthesia may be contacted by calling the Same Day Surgery Department, Mon. through Fri., 6 am to 4 pm at [phone].   e. If you experience any other problems or concerns, please contact your surgeon's office. f. If you experience severe or prolonged shortness of breath go to the nearest emergency department.   AMBULATORY SURGERY  DISCHARGE INSTRUCTIONS   1) The drugs that you were given will stay in your system until tomorrow so for the next 24 hours you should not:  A) Drive an automobile B) Make any legal decisions C) Drink any alcoholic beverage   2) You may resume regular meals tomorrow.  Today it is better to start with liquids and gradually work up to solid foods.  You may eat anything you prefer, but it is better to start with liquids, then soup and crackers, and gradually work up to solid  foods.   3) Please notify your doctor immediately if you have any unusual bleeding, trouble breathing, redness and pain at the surgery site, drainage, fever, or pain not relieved by medication.    4) Additional Instructions:        Please contact your physician with any problems or Same Day Surgery at [phone], Monday through Friday 6 am to 4 pm, or [place] [facility] number at [phone].

## 2020-04-10 ENCOUNTER — Encounter: Payer: Self-pay | Admitting: Orthopedic Surgery

## 2020-04-22 ENCOUNTER — Encounter: Payer: Self-pay | Admitting: Dermatology

## 2020-10-01 ENCOUNTER — Other Ambulatory Visit: Payer: Self-pay | Admitting: Orthopedic Surgery

## 2020-10-06 ENCOUNTER — Encounter: Payer: Self-pay | Admitting: Orthopedic Surgery

## 2020-10-16 ENCOUNTER — Ambulatory Visit: Payer: Managed Care, Other (non HMO) | Admitting: Anesthesiology

## 2020-10-16 ENCOUNTER — Other Ambulatory Visit: Payer: Self-pay

## 2020-10-16 ENCOUNTER — Ambulatory Visit
Admission: RE | Admit: 2020-10-16 | Discharge: 2020-10-16 | Disposition: A | Discharge status: Home / Self Care | Payer: Managed Care, Other (non HMO) | Attending: Orthopedic Surgery | Admitting: Orthopedic Surgery

## 2020-10-16 ENCOUNTER — Encounter: Payer: Self-pay | Admitting: Orthopedic Surgery

## 2020-10-16 ENCOUNTER — Encounter
Admission: RE | Disposition: A | Discharge status: Home / Self Care | Payer: Self-pay | Source: Home / Self Care | Attending: Orthopedic Surgery

## 2020-10-16 DIAGNOSIS — Z87891 Personal history of nicotine dependence: Secondary | ICD-10-CM | POA: Diagnosis not present

## 2020-10-16 DIAGNOSIS — M25811 Other specified joint disorders, right shoulder: Secondary | ICD-10-CM | POA: Insufficient documentation

## 2020-10-16 DIAGNOSIS — M65811 Other synovitis and tenosynovitis, right shoulder: Secondary | ICD-10-CM | POA: Insufficient documentation

## 2020-10-16 DIAGNOSIS — M7551 Bursitis of right shoulder: Secondary | ICD-10-CM | POA: Insufficient documentation

## 2020-10-16 DIAGNOSIS — M7501 Adhesive capsulitis of right shoulder: Secondary | ICD-10-CM | POA: Insufficient documentation

## 2020-10-16 HISTORY — PX: CLOSED MANIPULATION SHOULDER WITH STERIOD INJECTION: SHX5611

## 2020-10-16 SURGERY — CLOSED MANIPULATION SHOULDER WITH STEROID INJECTION
Anesthesia: General | Site: Shoulder | Laterality: Right

## 2020-10-16 MED ORDER — LACTATED RINGERS IR SOLN
Status: DC | PRN
  Administered 2020-10-16: 27000 mL

## 2020-10-16 MED ORDER — IBUPROFEN 800 MG PO TABS: 800.0000 mg | ORAL_TABLET | Freq: Three times a day (TID) | ORAL | 1 refills | Status: AC

## 2020-10-16 MED ORDER — DEXAMETHASONE SODIUM PHOSPHATE 4 MG/ML IJ SOLN
INTRAMUSCULAR | Status: DC | PRN
  Administered 2020-10-16: 4 mg via INTRAVENOUS

## 2020-10-16 MED ORDER — BUPIVACAINE LIPOSOME 1.3 % IJ SUSP
INTRAMUSCULAR | Status: DC | PRN
  Administered 2020-10-16: 20 mL

## 2020-10-16 MED ORDER — ASPIRIN EC 325 MG PO TBEC: 325.0000 mg | DELAYED_RELEASE_TABLET | Freq: Every day | ORAL | 0 refills | Status: AC

## 2020-10-16 MED ORDER — ACETAMINOPHEN 500 MG PO TABS: 1000.0000 mg | ORAL_TABLET | Freq: Three times a day (TID) | ORAL | 2 refills | Status: DC

## 2020-10-16 MED ORDER — ONDANSETRON HCL 4 MG/2ML IJ SOLN
INTRAMUSCULAR | Status: DC | PRN
  Administered 2020-10-16: 4 mg via INTRAVENOUS

## 2020-10-16 MED ORDER — OXYCODONE HCL 5 MG/5ML PO SOLN: 5.0000 mg | Freq: Once | ORAL | Status: DC | PRN

## 2020-10-16 MED ORDER — PROPOFOL 10 MG/ML IV BOLUS
INTRAVENOUS | Status: DC | PRN
  Administered 2020-10-16: 170 mg via INTRAVENOUS

## 2020-10-16 MED ORDER — OXYCODONE HCL 5 MG PO TABS: 5.0000 mg | ORAL_TABLET | ORAL | 0 refills | Status: DC | PRN

## 2020-10-16 MED ORDER — OXYCODONE HCL 5 MG PO TABS: 5.0000 mg | ORAL_TABLET | Freq: Once | ORAL | Status: DC | PRN

## 2020-10-16 MED ORDER — MIDAZOLAM HCL 2 MG/2ML IJ SOLN
INTRAMUSCULAR | Status: DC | PRN
  Administered 2020-10-16 (×2): 2 mg via INTRAVENOUS

## 2020-10-16 MED ORDER — GLYCOPYRROLATE 0.2 MG/ML IJ SOLN
INTRAMUSCULAR | Status: DC | PRN
  Administered 2020-10-16: .1 mg via INTRAVENOUS

## 2020-10-16 MED ORDER — BUPIVACAINE HCL 0.5 % IJ SOLN
INTRAMUSCULAR | Status: DC | PRN
  Administered 2020-10-16: 8 mL

## 2020-10-16 MED ORDER — LACTATED RINGERS IV SOLN
INTRAVENOUS | Status: DC | PRN
  Administered 2020-10-16: 4 mL

## 2020-10-16 MED ORDER — TRIAMCINOLONE ACETONIDE 40 MG/ML IJ SUSP
INTRAMUSCULAR | Status: DC | PRN
  Administered 2020-10-16: 80 mg

## 2020-10-16 MED ORDER — LACTATED RINGERS IV SOLN: INTRAVENOUS | Status: DC

## 2020-10-16 MED ORDER — BUPIVACAINE HCL (PF) 0.5 % IJ SOLN
INTRAMUSCULAR | Status: DC | PRN
  Administered 2020-10-16: 20 mL

## 2020-10-16 MED ORDER — FENTANYL CITRATE (PF) 100 MCG/2ML IJ SOLN
INTRAMUSCULAR | Status: DC | PRN
  Administered 2020-10-16: 100 ug via INTRAVENOUS

## 2020-10-16 MED ORDER — CEFAZOLIN SODIUM-DEXTROSE 2-4 GM/100ML-% IV SOLN
2.0000 g | INTRAVENOUS | Status: AC
  Administered 2020-10-16: 2 g via INTRAVENOUS

## 2020-10-16 MED ORDER — LIDOCAINE HCL (CARDIAC) PF 100 MG/5ML IV SOSY
PREFILLED_SYRINGE | INTRAVENOUS | Status: DC | PRN
  Administered 2020-10-16: 60 mg via INTRATRACHEAL

## 2020-10-16 SURGICAL SUPPLY — 39 items
ADAPTER IRRIG TUBE 2 SPIKE SOL (ADAPTER) ×6 IMPLANT
ADPR TBG 2 SPK PMP STRL ASCP (ADAPTER) ×2
APL PRP STRL LF DISP 70% ISPRP (MISCELLANEOUS) ×1
BUR RADIUS 3.5 (BURR) ×2 IMPLANT
CANNULA 5.75X7CM (CANNULA) ×1
CANNULA PART THRD DISP 5.75X7 (CANNULA) ×1 IMPLANT
CHLORAPREP W/TINT 26 (MISCELLANEOUS) ×3 IMPLANT
COOLER POLAR GLACIER W/PUMP (MISCELLANEOUS) ×3 IMPLANT
COVER LIGHT HANDLE UNIVERSAL (MISCELLANEOUS) ×6 IMPLANT
DRAPE INCISE IOBAN 66X45 STRL (DRAPES) ×3 IMPLANT
DRAPE U-SHAPE 48X52 POLY STRL (PACKS) ×3 IMPLANT
DRSG TEGADERM 4X4.75 (GAUZE/BANDAGES/DRESSINGS) ×8 IMPLANT
GAUZE XEROFORM 1X8 LF (GAUZE/BANDAGES/DRESSINGS) ×3 IMPLANT
GLOVE SURG ENC MOIS LTX SZ7.5 (GLOVE) ×6 IMPLANT
GLOVE SURG UNDER LTX SZ8 (GLOVE) ×4 IMPLANT
GOWN STRL REIN 2XL XLG LVL4 (GOWN DISPOSABLE) ×3 IMPLANT
GOWN STRL REUS W/ TWL LRG LVL3 (GOWN DISPOSABLE) ×2 IMPLANT
GOWN STRL REUS W/TWL LRG LVL3 (GOWN DISPOSABLE) ×3
IV LACTATED RINGER IRRG 3000ML (IV SOLUTION) ×27
IV LR IRRIG 3000ML ARTHROMATIC (IV SOLUTION) ×8 IMPLANT
KIT STABILIZATION SHOULDER (MISCELLANEOUS) ×3 IMPLANT
KIT TURNOVER KIT A (KITS) ×3 IMPLANT
MANIFOLD NEPTUNE II (INSTRUMENTS) ×3 IMPLANT
MASK FACE SPIDER DISP (MASK) ×3 IMPLANT
MAT GRAY ABSORB FLUID 28X50 (MISCELLANEOUS) ×6 IMPLANT
NDL SAFETY ECLIPSE 18X1.5 (NEEDLE) ×1 IMPLANT
NEEDLE HYPO 18GX1.5 SHARP (NEEDLE) ×3
PACK ARTHROSCOPY SHOULDER (MISCELLANEOUS) ×3 IMPLANT
PAD WRAPON POLAR SHDR XLG (MISCELLANEOUS) ×1 IMPLANT
SET TUBE SUCT SHAVER OUTFL 24K (TUBING) ×3 IMPLANT
SPONGE GAUZE 2X2 8PLY STER LF (GAUZE/BANDAGES/DRESSINGS) ×2
SPONGE GAUZE 2X2 8PLY STRL LF (GAUZE/BANDAGES/DRESSINGS) ×6 IMPLANT
SUT ETHILON 3-0 FS-10 30 BLK (SUTURE) ×3
SUTURE EHLN 3-0 FS-10 30 BLK (SUTURE) IMPLANT
SYR 10ML LL (SYRINGE) ×3 IMPLANT
TAPE MICROFOAM 4IN (TAPE) ×3 IMPLANT
TUBING ARTHRO INFLOW-ONLY STRL (TUBING) ×3 IMPLANT
WAND WEREWOLF FLOW 90D (MISCELLANEOUS) ×3 IMPLANT
WRAPON POLAR PAD SHDR XLG (MISCELLANEOUS) ×3

## 2020-10-16 NOTE — Op Note (Signed)
OPERATIVE NOTE SURGERY DATE: 10/16/2020  PRE-OP DIAGNOSIS: 1. Right shoulder adhesive capsulitis  2. Right subacromial bursitis, impingement  POST-OP DIAGNOSIS:  1. Right shoulder adhesive capsulitis  2. Right subacromial bursitis, impingement  PROCEDURES: 1. Right shoulder capsular releases, lysis of adhesions, manipulation under anesthesia  2. Right subacromial decompression without acromioplasty  3. Right biceps tenotomy 4. Right glenohumeral and subacromial injections with corticosteroid  SURGEON:  Cato Mulligan, MD  ASSISTANT(S):  none  ANESTHESIA: Regional block with Exparil, Gen  TOTAL IV FLUIDS: per anesthesia record  ESTIMATED BLOOD LOSS: Minimal  DRAINS:  None.  SPECIMENS: None  IMPLANTS: None.  COMPLICATIONS: none  INDICATIONS: Cristian Fernandez is a 40 y.o. male who initially underwent a right posterior labral repair and capsulorraphy by me on 04/09/20. He has undergone extensive physical therapy but still has shoulder stiffness. Preoperative left shoulder examination was notable for motion loss. Preoperative pain symptoms were completely resolved.  Surgery was recommended for capsular releases, manipulation under anesthesia, and subacromial decompression/bursectomy with corticosteroid injections into glenohumeral joint and subacromial space. After discussion of risks, benefits, and alternatives to surgery, the patient elected to proceed.    OPERATIVE FINDINGS:  Operative Shoulder Range of Motion:  Preop  Postop  Flexion  130 160  Abduction  70 120  ER at 0  30 70  ER at 90  70 110  IR at 90  10 45  IR posterior  T12 T6    Intra-operative findings: A thorough arthroscopic examination of the shoulder was performed.  The findings are: 1. Biceps tendon:  Significant thickening and tendinopathy of the proximal insertion and significant erythema of the entire intraarticular portion 2. Superior labrum: injected with surrounding synovitis 3. Posterior labrum  and capsule: Healed posterior labrum from 7:00 to 10 o'clock position. 4. Inferior capsule and inferior recess: Significant synovitis and thickening of capsule 5. Glenoid cartilage surface: few areas of Grade 1 changes  6. Supraspinatus attachment: Normal 7. Posterior rotator cuff attachment:  Normal 8. Humeral head articular cartilage: few areas of grade 1 changes 9. Rotator interval: significant synovitis and thickening of capsule 10: Subscapularis tendon:  Normal 11. Anterior labrum: Normal 12. IGHL: significant synovitis around IGHL   DETAILS OF PROCEDURE: The patient was identified in the preoperative holding area. Informed consent was obtained. Operative extremity was marked. After satisfactory upper extremity regional block with Exparel was performed in the preoperative holding area, the patient was brought to the operating room and placed in a well-padded beach chair positioner.  Eyes were protected, head was affixed in neutral, and the patient was given preoperative IV antibiotics within 30 minutes of the start of the case and a surgical time-out occurred. The upper extremity was prescrubbed with Hibiclens and alcohol, prepped with ChloraPrep and draped in the usual sterile fashion.    I then created a standard posterior portal with an 11 blade. The glenohumeral joint was easily entered with a blunt trochar and the arthroscope introduced. The findings of diagnostic arthroscopy are described above.  A standard anterior portal was made.  The joint was remarkable for moderate synovitis which was chronic in the anterior, superior, posterior, and inferior aspects. This required synovectomy with a shaver and Arthrocare device in the affected compartments listed above.  A combination of electrocautery and oscillating shaver was used to debride the rotator interval tissue.  The posterior aspect of the coracoid was exposed.  The anterior and posterior aspects of the subscapularis were cleared of  tissue so there was no  tethering.  Given the pathology of the biceps tendon as well as the patient's desire to ideally only have one surgery, a biceps tenotomy was performed with an ArthroCare wand.  The stump of the biceps was debrided with an oscillating shaver.  Next, an upbiting duckbill basket was then used to perform a capsulotomy of the rotator interval and then the MGHL and the IGHL (anterior band).  Care was taken to protect the intraarticular subscapularis.  Adhesions were cleared off the subscapularis to allow full internal and external rotation.    The arthroscope was placed into the anterior portal.  The posterior capsule was quite thickened and inflamed as well.  After synovectomy, the duckbill basket was used to perform release from the superior glenoid, down into the axillary pouch, around to the anterior band of the IGHL.  A complete 360 capsulotomy was performed in this manner.  Care was taken to protect the axillary nerve by staying on the glenoid side and making sure not to rotate the shoulder externally during the capsulotomy.  Hemostasis was achieved with the ArthroCare wand.  There was no unusual bleeding.    The arthroscope was placed in the subacromial space.  The previously utilized anterolateral portal was established. There was a moderate amount of bursitis filling the subacromial space and gutters.  A complete subacromial bursectomy and debridement of the gutters was carried out with a shaver.  ArthroCare was used to control bleeding.  Arthroscopic fluid was removed and this concluded the arthroscopic portion of the procedure.  Manipulation under anesthesia was carried out in a gentle, controlled manner with short lever arms.  See above chart for post-manipulation improvement in range of motion.   The skin was closed with interrupted 3-0 nylon sutures. Injections of 40 mg Kenalog with 1% lidocaine and 0.5% ropivacaine were placed separately in the glenohumeral joint and  subacromial space.  Xeroform gauze, sterile dressings were applied. The patient was placed in a shoulder sling.  Polar Care was applied.    Instrument, sponge, and needle counts were correct prior to closure and at the conclusion of the case.   DISPOSITION: PACU - hemodynamically stable.  POSTOPERATIVE PLAN: The patient will be discharged home. PT to begin within 3 days postoperatively for range of motion exercises.  Patient instructed to start passive range of motion exercises the day after surgery.  ASA x 2 weeks for DVT ppx. Sling only for comfort and wean this week as soon as tolerated. RTC in approximately 2 weeks.

## 2020-10-16 NOTE — Transfer of Care (Signed)
Immediate Anesthesia Transfer of Care Note  Patient: Cristian Fernandez  Procedure(s) Performed: Right shoulder arthroscopic capsular release, lysis of adhesions, biceps tenotomy, and manipulation anesthesia with corticosteroid injection (Right: Shoulder)  Patient Location: PACU  Anesthesia Type: General LMA  Level of Consciousness: awake, alert  and patient cooperative  Airway and Oxygen Therapy: Patient Spontanous Breathing and Patient connected to supplemental oxygen  Post-op Assessment: Post-op Vital signs reviewed, Patient's Cardiovascular Status Stable, Respiratory Function Stable, Patent Airway and No signs of Nausea or vomiting  Post-op Vital Signs: Reviewed and stable  Complications: No notable events documented.

## 2020-10-16 NOTE — Progress Notes (Signed)
Assisted Debabtrata Maji ANMD  with right, ultrasound guided, interscalene  block. Side rails up, monitors on throughout procedure. See vital signs in flow sheet. Tolerated Procedure well.

## 2020-10-16 NOTE — Anesthesia Procedure Notes (Signed)
Anesthesia Regional Block: Interscalene brachial plexus block   Pre-Anesthetic Checklist: , timeout performed,  Correct Patient, Correct Site, Correct Laterality,  Correct Procedure, Correct Position, site marked,  Risks and benefits discussed,  Surgical consent,  Pre-op evaluation,  At surgeon's request and post-op pain management  Laterality: Right  Prep: chloraprep       Needles:  Injection technique: Single-shot  Needle Type: Stimiplex     Needle Length: 10cm  Needle Gauge: 21     Additional Needles:   Procedures:,,,, ultrasound used (permanent image in chart),,    Narrative:  Start time: 10/16/2020 11:28 AM End time: 10/16/2020 11:33 AM Injection made incrementally with aspirations every 5 mL.  Performed by: Personally  Anesthesiologist: Fidel Levy, MD  Additional Notes: Functioning IV was confirmed and monitors applied. Ultrasound guidance: relevant anatomy identified, needle position confirmed, local anesthetic spread visualized around nerve(s)., vascular puncture avoided.  Image printed for medical record.  Negative aspiration and no paresthesias; incremental administration of local anesthetic. The patient tolerated the procedure well. Vitals signes recorded in RN notes.

## 2020-10-16 NOTE — Anesthesia Procedure Notes (Signed)
Procedure Name: LMA Insertion Date/Time: 10/16/2020 12:22 PM Performed by: Dionne Bucy, CRNA Pre-anesthesia Checklist: Patient identified, Patient being monitored, Timeout performed, Emergency Drugs available and Suction available Patient Re-evaluated:Patient Re-evaluated prior to induction Oxygen Delivery Method: Circle system utilized Preoxygenation: Pre-oxygenation with 100% oxygen Induction Type: IV induction Ventilation: Mask ventilation without difficulty LMA: LMA inserted LMA Size: 4.0 Tube type: Oral Number of attempts: 1 Placement Confirmation: positive ETCO2 and breath sounds checked- equal and bilateral Tube secured with: Tape Dental Injury: Teeth and Oropharynx as per pre-operative assessment

## 2020-10-16 NOTE — Anesthesia Postprocedure Evaluation (Signed)
Anesthesia Post Note  Patient: Moosa E Krawiec  Procedure(s) Performed: Right shoulder arthroscopic capsular release, lysis of adhesions, biceps tenotomy, and manipulation anesthesia with corticosteroid injection (Right: Shoulder)     Patient location during evaluation: PACU Anesthesia Type: General Level of consciousness: awake and alert Pain management: pain level controlled Vital Signs Assessment: post-procedure vital signs reviewed and stable Respiratory status: spontaneous breathing, nonlabored ventilation, respiratory function stable and patient connected to nasal cannula oxygen Cardiovascular status: blood pressure returned to baseline and stable Postop Assessment: no apparent nausea or vomiting Anesthetic complications: no   No notable events documented.  Fidel Levy

## 2020-10-16 NOTE — Anesthesia Preprocedure Evaluation (Addendum)
Anesthesia Evaluation  Patient identified by MRN, date of birth, ID band Patient awake    Reviewed: NPO status   History of Anesthesia Complications (+) PROLONGED EMERGENCE and history of anesthetic complications  Airway Mallampati: II  TM Distance: >3 FB Neck ROM: full    Dental no notable dental hx.    Pulmonary pneumonia (double pneumonia  04/2019), resolved, former smoker,    Pulmonary exam normal        Cardiovascular Exercise Tolerance: Good negative cardio ROS Normal cardiovascular exam     Neuro/Psych negative neurological ROS  negative psych ROS   GI/Hepatic negative GI ROS, Neg liver ROS,   Endo/Other  negative endocrine ROS  Renal/GU negative Renal ROS  negative genitourinary   Musculoskeletal   Abdominal   Peds  Hematology negative hematology ROS (+)   Anesthesia Other Findings   Reproductive/Obstetrics                             Anesthesia Physical Anesthesia Plan  ASA: 1  Anesthesia Plan: General LMA   Post-op Pain Management: GA combined w/ Regional for post-op pain   Induction:   PONV Risk Score and Plan:   Airway Management Planned:   Additional Equipment:   Intra-op Plan:   Post-operative Plan:   Informed Consent: I have reviewed the patients History and Physical, chart, labs and discussed the procedure including the risks, benefits and alternatives for the proposed anesthesia with the patient or authorized representative who has indicated his/her understanding and acceptance.       Plan Discussed with: CRNA  Anesthesia Plan Comments: (ISBlock with Exparel)       Anesthesia Quick Evaluation

## 2020-10-16 NOTE — Discharge Instructions (Signed)
Post-Op Instructions - Shoulder Capsular Release/Manipulation Under Anesthesia  1. Bracing: You should wear a sling for comfort only. Sling should NOT be worn longer than ~1 week.   2. Driving: No driving for ~1 week post-op. Must be off narcotic pain medication.  3. Activity: No active lifting for ~2 weeks. Perform range of motion exercises DAILY at home and with physical therapy as prescribed.   4. Physical Therapy: Start by postop day #3. This should be at least 2x/week. Start doing passive range of motion exercises the day after surgery.   5. Medications:  - You will be provided a prescription for narcotic pain medicine. After surgery, take 1-2 narcotic tablets every 4 hours if needed for severe pain.  - A prescription for anti-nausea medication will be provided in case the narcotic medicine causes nausea - take 1 tablet every 6 hours only if nauseated.   - Take tylenol 1000 mg (2 Extra Strength tablets or 3 regular strength) every 8 hours for pain.  May decrease or stop tylenol 5 days after surgery if you are having minimal pain. - Take ibuprofen 800mg  three times/day with food for at least two weeks every day. This will help reduce post-operative inflammation and swelling. Please call our offices if this causes any stomach/GI irritation.  - Take Aspirin 325mg /daily x 2 weeks to help prevent DVT/PE (Blood clots)   6. Post-Op Appointment:  Your first post-op appointment will be ~2 weeks post-op.  7. Work or School: For most, but not all procedures, we advise staying out of work or school for at least 1 to 2 weeks in order to recover from the stress of surgery and to allow time for healing.   If you need a work or school note this can be provided.

## 2020-10-16 NOTE — H&P (Signed)
Paper H&P to be scanned into permanent record. H&P reviewed. No significant changes noted.  

## 2020-10-19 ENCOUNTER — Encounter: Payer: Self-pay | Admitting: Orthopedic Surgery

## 2020-12-17 ENCOUNTER — Other Ambulatory Visit: Payer: Self-pay

## 2020-12-17 ENCOUNTER — Ambulatory Visit (INDEPENDENT_AMBULATORY_CARE_PROVIDER_SITE_OTHER): Payer: Managed Care, Other (non HMO) | Admitting: Dermatology

## 2020-12-17 DIAGNOSIS — L821 Other seborrheic keratosis: Secondary | ICD-10-CM

## 2020-12-17 DIAGNOSIS — D225 Melanocytic nevi of trunk: Secondary | ICD-10-CM

## 2020-12-17 DIAGNOSIS — D229 Melanocytic nevi, unspecified: Secondary | ICD-10-CM

## 2020-12-17 DIAGNOSIS — L905 Scar conditions and fibrosis of skin: Secondary | ICD-10-CM | POA: Diagnosis not present

## 2020-12-17 DIAGNOSIS — L578 Other skin changes due to chronic exposure to nonionizing radiation: Secondary | ICD-10-CM

## 2020-12-17 DIAGNOSIS — Z1283 Encounter for screening for malignant neoplasm of skin: Secondary | ICD-10-CM

## 2020-12-17 DIAGNOSIS — L814 Other melanin hyperpigmentation: Secondary | ICD-10-CM

## 2020-12-17 DIAGNOSIS — D1800 Hemangioma unspecified site: Secondary | ICD-10-CM

## 2020-12-17 NOTE — Patient Instructions (Signed)
Melanoma ABCDEs  Melanoma is the most dangerous type of skin cancer, and is the leading cause of death from skin disease.  You are more likely to develop melanoma if you: Have light-colored skin, light-colored eyes, or red or blond hair Spend a lot of time in the sun Tan regularly, either outdoors or in a tanning bed Have had blistering sunburns, especially during childhood Have a close family member who has had a melanoma Have atypical moles or large birthmarks  Early detection of melanoma is key since treatment is typically straightforward and cure rates are extremely high if we catch it early.   The first sign of melanoma is often a change in a mole or a new dark spot.  The ABCDE system is a way of remembering the signs of melanoma.  A for asymmetry:  The two halves do not match. B for border:  The edges of the growth are irregular. C for color:  A mixture of colors are present instead of an even brown color. D for diameter:  Melanomas are usually (but not always) greater than 81mm - the size of a pencil eraser. E for evolution:  The spot keeps changing in size, shape, and color.  Please check your skin once per month between visits. You can use a small mirror in front and a large mirror behind you to keep an eye on the back side or your body.   If you see any new or changing lesions before your next follow-up, please call to schedule a visit.  Please continue daily skin protection including broad spectrum sunscreen SPF 30+ to sun-exposed areas, reapplying every 2 hours as needed when you're outdoors.    Recommend taking Heliocare sun protection supplement daily in sunny weather for additional sun protection. For maximum protection on the sunniest days, you can take up to 2 capsules of regular Heliocare OR take 1 capsule of Heliocare Ultra. For prolonged exposure (such as a full day in the sun), you can repeat your dose of the supplement 4 hours after your first dose. Heliocare can be  purchased at [facility] or at [email or web address].    If you have any questions or concerns for your doctor, please call our main line at [phone] and press option 4 to reach your doctor's medical assistant. If no one answers, please leave a voicemail as directed and we will return your call as soon as possible. Messages left after 4 pm will be answered the following business day.   You may also send [place] a message via [place]. We typically respond to MyChart messages within 1-2 business days.  For prescription refills, please ask your pharmacy to contact our office. Our fax number is [phone].  If you have an urgent issue when the clinic is closed that cannot wait until the next business day, you can page your doctor at the number below.    Please note that while we do our best to be available for urgent issues outside of office hours, we are not available 24/7.   If you have an urgent issue and are unable to reach [place], you may choose to seek medical care at your doctor's office, retail clinic, urgent care center, or emergency room.  If you have a medical emergency, please immediately call 911 or go to the emergency department.  Pager Numbers  - Dr. Nehemiah Massed: [phone]  - Dr. Laurence Ferrari: [phone]  - Dr. Nicole Kindred: [phone]  In the event of inclement weather, please call our main line  at [phone] for an update on the status of any delays or closures.  Dermatology Medication Tips: Please keep the boxes that topical medications come in in order to help keep track of the instructions about where and how to use these. Pharmacies typically print the medication instructions only on the boxes and not directly on the medication tubes.   If your medication is too expensive, please contact our office at [phone] option 4 or send [place] a message through [place].   We are unable to tell what your co-pay for medications will be in advance as this is different depending on your  insurance coverage. However, we may be able to find a substitute medication at lower cost or fill out paperwork to get insurance to cover a needed medication.   If a prior authorization is required to get your medication covered by your insurance company, please allow [place] 1-2 business days to complete this process.  Drug prices often vary depending on where the prescription is filled and some pharmacies may offer cheaper prices.  The website www.goodrx.com contains coupons for medications through different pharmacies. The prices here do not account for what the cost may be with help from insurance (it may be cheaper with your insurance), but the website can give you the price if you did not use any insurance.  - You can print the associated coupon and take it with your prescription to the pharmacy.  - You may also stop by our office during regular business hours and pick up a GoodRx coupon card.  - If you need your prescription sent electronically to a different pharmacy, notify our office through [facility] or by phone at [phone] option 4.

## 2020-12-17 NOTE — Progress Notes (Signed)
   Follow-Up Visit   Subjective  Cristian Fernandez is a 40 y.o. male who presents for the following: TBSE (Patient here for full body skin exam and skin cancer screening. Patient with no hx of skin cancer or dysplastic nevi. He is not aware of any new or changing spots. There are 2 nevi at back and 1 at lip we are monitoring but patient has not noticed any change. ).  The following portions of the chart were reviewed this encounter and updated as appropriate:   Tobacco  Allergies  Meds  Problems  Med Hx  Surg Hx  Fam Hx      Review of Systems:  No other skin or systemic complaints except as noted in HPI or Assessment and Plan.  Objective  Well appearing patient in no apparent distress; mood and affect are within normal limits.  A full examination was performed including scalp, head, eyes, ears, nose, lips, neck, chest, axillae, abdomen, back, buttocks, bilateral upper extremities, bilateral lower extremities, hands, feet, fingers, toes, fingernails, and toenails. All findings within normal limits unless otherwise noted below.  left upper cutaneous lip 0.8cm light pink thin papule  Left Lower Back Right mid back - 0.5cm med brown thin papule with darker focus 12 o'clock to 3 o'clock  Low mid back - 0.5cm light brown thin papule with darker focus at 11 o'clock   Assessment & Plan  Scar left upper cutaneous lip  Vs nevus  No change  Benign-appearing.  Observation.  Call clinic for new or changing lesions.  Recommend daily use of broad spectrum spf 30+ sunscreen to sun-exposed areas.  Nevus Left Lower Back  No change from prior.  Benign-appearing.  Observation.  Call clinic for new or changing lesions.  Recommend daily use of broad spectrum spf 30+ sunscreen to sun-exposed areas.    Related Medications mupirocin ointment (BACTROBAN) 2 % Apply 1 application topically daily.  Lentigines - Scattered tan macules - Due to sun exposure - Benign-appering, observe -  Recommend daily broad spectrum sunscreen SPF 30+ to sun-exposed areas, reapply every 2 hours as needed. - Call for any changes  Seborrheic Keratoses - Stuck-on, waxy, tan-brown papules and/or plaques  - Benign-appearing - Discussed benign etiology and prognosis. - Observe - Call for any changes  Melanocytic Nevi - Tan-brown and/or pink-flesh-colored symmetric macules and papules - Benign appearing on exam today - Observation - Call clinic for new or changing moles - Recommend daily use of broad spectrum spf 30+ sunscreen to sun-exposed areas.   Hemangiomas - Red papules - Discussed benign nature - Observe - Call for any changes  Actinic Damage - Chronic condition, secondary to cumulative UV/sun exposure - diffuse scaly erythematous macules with underlying dyspigmentation - Recommend daily broad spectrum sunscreen SPF 30+ to sun-exposed areas, reapply every 2 hours as needed.  - Staying in the shade or wearing long sleeves, sun glasses (UVA+UVB protection) and wide brim hats (4-inch brim around the entire circumference of the hat) are also recommended for sun protection.  - Call for new or changing lesions.  Skin cancer screening performed today.  Return in about 1 year (around 12/17/2021) for TBSE.  Graciella Belton, RMA, am acting as scribe for Forest Gleason, MD .  Documentation: I have reviewed the above documentation for accuracy and completeness, and I agree with the above.  Forest Gleason, MD

## 2020-12-20 ENCOUNTER — Encounter: Payer: Self-pay | Admitting: Dermatology

## 2021-03-22 ENCOUNTER — Encounter: Payer: Managed Care, Other (non HMO) | Admitting: Family Medicine

## 2021-06-17 ENCOUNTER — Other Ambulatory Visit: Payer: Self-pay

## 2021-06-17 DIAGNOSIS — Z0000 Encounter for general adult medical examination without abnormal findings: Secondary | ICD-10-CM

## 2021-06-21 ENCOUNTER — Other Ambulatory Visit (INDEPENDENT_AMBULATORY_CARE_PROVIDER_SITE_OTHER): Payer: Managed Care, Other (non HMO)

## 2021-06-21 ENCOUNTER — Other Ambulatory Visit: Payer: Self-pay

## 2021-06-21 DIAGNOSIS — Z0000 Encounter for general adult medical examination without abnormal findings: Secondary | ICD-10-CM | POA: Diagnosis not present

## 2021-06-21 LAB — CBC
HCT: 40.5 % (ref 39.0–52.0)
Hemoglobin: 13.7 g/dL (ref 13.0–17.0)
MCHC: 33.9 g/dL (ref 30.0–36.0)
MCV: 87.6 fl (ref 78.0–100.0)
Platelets: 198 10*3/uL (ref 150.0–400.0)
RBC: 4.62 Mil/uL (ref 4.22–5.81)
RDW: 12.6 % (ref 11.5–15.5)
WBC: 6.1 10*3/uL (ref 4.0–10.5)

## 2021-06-21 LAB — COMPREHENSIVE METABOLIC PANEL
ALT: 19 U/L (ref 0–53)
AST: 21 U/L (ref 0–37)
Albumin: 4.7 g/dL (ref 3.5–5.2)
Alkaline Phosphatase: 68 U/L (ref 39–117)
BUN: 17 mg/dL (ref 6–23)
CO2: 31 mEq/L (ref 19–32)
Calcium: 9.7 mg/dL (ref 8.4–10.5)
Chloride: 103 mEq/L (ref 96–112)
Creatinine, Ser: 1.13 mg/dL (ref 0.40–1.50)
GFR: 79.94 mL/min (ref >60.00)
Glucose, Bld: 92 mg/dL (ref 70–99)
Potassium: 4.9 mEq/L (ref 3.5–5.1)
Sodium: 140 mEq/L (ref 135–145)
Total Bilirubin: 0.6 mg/dL (ref 0.2–1.2)
Total Protein: 6.8 g/dL (ref 6.0–8.3)

## 2021-06-21 LAB — LIPID PANEL
Cholesterol: 174 mg/dL (ref 0–200)
HDL: 48.1 mg/dL (ref >39.00)
LDL Cholesterol: 88 mg/dL (ref 0–99)
NonHDL: 126.3
Total CHOL/HDL Ratio: 4
Triglycerides: 194 mg/dL — ABNORMAL HIGH (ref 0.0–149.0)
VLDL: 38.8 mg/dL (ref 0.0–40.0)

## 2021-06-22 LAB — PSA: PSA: 0.66 ng/mL (ref 0.10–4.00)

## 2021-06-22 LAB — TSH: TSH: 4.01 u[IU]/mL (ref 0.35–5.50)

## 2021-06-24 ENCOUNTER — Encounter: Payer: Self-pay | Admitting: Family Medicine

## 2021-06-25 ENCOUNTER — Other Ambulatory Visit: Payer: Self-pay

## 2021-06-25 ENCOUNTER — Encounter: Payer: Self-pay | Admitting: Family Medicine

## 2021-06-25 ENCOUNTER — Ambulatory Visit (INDEPENDENT_AMBULATORY_CARE_PROVIDER_SITE_OTHER): Payer: Managed Care, Other (non HMO) | Admitting: Family Medicine

## 2021-06-25 VITALS — BP 100/60 | HR 62 | Temp 97.9°F | Ht 73.0 in | Wt 177.4 lb

## 2021-06-25 DIAGNOSIS — Z0000 Encounter for general adult medical examination without abnormal findings: Secondary | ICD-10-CM

## 2021-06-25 DIAGNOSIS — Z23 Encounter for immunization: Secondary | ICD-10-CM

## 2021-06-25 NOTE — Patient Instructions (Signed)
Nice to see you. ?Please keep up with a healthy diet and exercise. ?

## 2021-06-25 NOTE — Assessment & Plan Note (Signed)
Physical exam completed.  I encouraged continued healthy diet and exercise.  Lab work was reviewed with the patient today.  Triglycerides are minimally elevated as he was not fasting.  Patient declines flu and COVID vaccines.  He is low risk for hepatitis C and declines screening.  Tetanus vaccine given today. ?

## 2021-06-25 NOTE — Progress Notes (Signed)
?Cristian Rumps, MD ?Phone: [phone] ? ?Cristian Fernandez is a 41 y.o. male who presents today for CPE. ? ?Diet: lean meats, vegetables, not many sweets, no soda or sweet tea ?Exercise: getting back in to small weights after having 2 shoulder surgeries ?Colonoscopy: not indicated ?Prostate cancer screening: UTD ?Family history- ? Prostate cancer: grandfather ? Colon cancer: no first degree relatives ?Vaccines-  ? Flu: declines ? Tetanus: due ? COVID19: declines ?HIV screening: declined in past ?Hep C Screening: declines ?Tobacco use: no ?Alcohol use: 2-3/week ?Illicit Drug use: no ?Dentist: yes ?Ophthalmology: yes ? ?The 10-year ASCVD risk score (Arnett DK, et al., 2019) is: 0.7% ?  Values used to calculate the score: ?    Age: 11 years ?    Sex: Male ?    Is Non-Hispanic African American: No ?    Diabetic: No ?    Tobacco smoker: No ?    Systolic Blood Pressure: 349 mmHg ?    Is BP treated: No ?    HDL Cholesterol: 48.1 mg/dL ?    Total Cholesterol: 174 mg/dL ? ? ?Active Ambulatory Problems  ?  Diagnosis Date Noted  ? Annual physical exam 06/06/2016  ? Family history of prostate cancer 03/17/2020  ? Ulnar neuropathy at elbow, right 01/10/2020  ? ?Resolved Ambulatory Problems  ?  Diagnosis Date Noted  ? ALLERGIC RHINITIS 09/29/2008  ? FATIGUE 09/29/2008  ? Allergic reaction 01/29/2014  ? Pneumonia 05/27/2019  ? Cervical radiculitis 01/10/2020  ? Chronic right shoulder pain 01/10/2020  ? Foraminal stenosis of cervical region 01/10/2020  ? Neck pain 01/10/2020  ? ?Past Medical History:  ?Diagnosis Date  ? Adverse effect of anesthesia   ? Allergic rhinitis   ? Knee injury   ? ? ?Family History  ?Problem Relation Age of Onset  ? Diabetes Mother   ? Benign prostatic hyperplasia Father   ? Prostate cancer Paternal Grandfather   ? ? ?Social History  ? ?Socioeconomic History  ? Marital status: Married  ?  Spouse name: Not on file  ? Number of children: 1  ? Years of education: Not on file  ? Highest education level: Not  on file  ?Occupational History  ? Occupation: desk job  ?Tobacco Use  ? Smoking status: Former  ?  Types: Cigarettes  ?  Quit date: 2006  ?  Years since quitting: 17.1  ? Smokeless tobacco: Never  ?Vaping Use  ? Vaping Use: Never used  ?Substance and Sexual Activity  ? Alcohol use: Yes  ?  Comment: wine sometimes   ? Drug use: Never  ? Sexual activity: Not on file  ?Other Topics Concern  ? Not on file  ?Social History Narrative  ? Not on file  ? ?Social Determinants of Health  ? ?Financial Resource Strain: Not on file  ?Food Insecurity: Not on file  ?Transportation Needs: Not on file  ?Physical Activity: Not on file  ?Stress: Not on file  ?Social Connections: Not on file  ?Intimate Partner Violence: Not on file  ? ? ?ROS ? ?General:  Negative for nexplained weight loss, fever ?Skin: Negative for new or changing mole, sore that won't heal ?HEENT: Negative for trouble hearing, trouble seeing, ringing in ears, mouth sores, hoarseness, change in voice, dysphagia. ?CV:  Negative for chest pain, dyspnea, edema, palpitations ?Resp: Negative for cough, dyspnea, hemoptysis ?GI: Negative for nausea, vomiting, diarrhea, constipation, abdominal pain, melena, hematochezia. ?GU: Negative for dysuria, incontinence, urinary hesitance, hematuria, vaginal or penile discharge, polyuria, sexual  difficulty, lumps in testicle or breasts ?MSK: Negative for muscle cramps or aches, joint pain or swelling ?Neuro: Negative for headaches, weakness, numbness, dizziness, passing out/fainting ?Psych: Negative for depression, anxiety, memory problems ? ?Objective ? ?Physical Exam ?Vitals:  ? 06/25/21 0949  ?BP: 100/60  ?Pulse: 62  ?Temp: 97.9 ?F (36.6 ?C)  ?SpO2: 97%  ? ? ?BP Readings from Last 3 Encounters:  ?06/25/21 100/60  ?10/16/20 116/90  ?04/09/20 111/62  ? ?Wt Readings from Last 3 Encounters:  ?06/25/21 177 lb 6.4 oz (80.5 kg)  ?10/16/20 164 lb 11.2 oz (74.7 kg)  ?04/09/20 168 lb (76.2 kg)  ? ? ?Physical Exam ?Constitutional:   ?    General: He is not in acute distress. ?   Appearance: He is not diaphoretic.  ?HENT:  ?   Head: Normocephalic and atraumatic.  ?Cardiovascular:  ?   Rate and Rhythm: Normal rate and regular rhythm.  ?   Heart sounds: Normal heart sounds.  ?Pulmonary:  ?   Effort: Pulmonary effort is normal.  ?   Breath sounds: Normal breath sounds.  ?Abdominal:  ?   General: Bowel sounds are normal. There is no distension.  ?   Palpations: Abdomen is soft.  ?   Tenderness: There is no abdominal tenderness.  ?Musculoskeletal:  ?   Right lower leg: No edema.  ?   Left lower leg: No edema.  ?Lymphadenopathy:  ?   Cervical: No cervical adenopathy.  ?Skin: ?   General: Skin is warm and dry.  ?Neurological:  ?   Mental Status: He is alert.  ?Psychiatric:     ?   Mood and Affect: Mood normal.  ? ? ? ?Assessment/Plan:  ? ?Problem List Items Addressed This Visit   ? ? Annual physical exam  ?  Physical exam completed.  I encouraged continued healthy diet and exercise.  Lab work was reviewed with the patient today.  Triglycerides are minimally elevated as he was not fasting.  Patient declines flu and COVID vaccines.  He is low risk for hepatitis C and declines screening.  Tetanus vaccine given today. ?  ?  ? ?Other Visit Diagnoses   ? ? Need for tetanus, diphtheria, and acellular pertussis (Tdap) vaccine    -  Primary  ? Relevant Orders  ? Tdap vaccine greater than or equal to 7yo IM (Completed)  ? ?  ? ? ?Return in about 1 year (around 06/26/2022) for CPE. ? ?This visit occurred during the SARS-CoV-2 public health emergency.  Safety protocols were in place, including screening questions prior to the visit, additional usage of staff PPE, and extensive cleaning of exam room while observing appropriate contact time as indicated for disinfecting solutions.  ? ? ?Cristian Rumps, MD ?[place] ? ?

## 2021-12-16 ENCOUNTER — Ambulatory Visit (INDEPENDENT_AMBULATORY_CARE_PROVIDER_SITE_OTHER): Payer: Managed Care, Other (non HMO) | Admitting: Dermatology

## 2021-12-16 DIAGNOSIS — L821 Other seborrheic keratosis: Secondary | ICD-10-CM

## 2021-12-16 DIAGNOSIS — D229 Melanocytic nevi, unspecified: Secondary | ICD-10-CM

## 2021-12-16 DIAGNOSIS — D1800 Hemangioma unspecified site: Secondary | ICD-10-CM

## 2021-12-16 DIAGNOSIS — L905 Scar conditions and fibrosis of skin: Secondary | ICD-10-CM

## 2021-12-16 DIAGNOSIS — L578 Other skin changes due to chronic exposure to nonionizing radiation: Secondary | ICD-10-CM

## 2021-12-16 DIAGNOSIS — L814 Other melanin hyperpigmentation: Secondary | ICD-10-CM

## 2021-12-16 DIAGNOSIS — Z1283 Encounter for screening for malignant neoplasm of skin: Secondary | ICD-10-CM | POA: Diagnosis not present

## 2021-12-16 DIAGNOSIS — B351 Tinea unguium: Secondary | ICD-10-CM | POA: Diagnosis not present

## 2021-12-16 DIAGNOSIS — D225 Melanocytic nevi of trunk: Secondary | ICD-10-CM | POA: Diagnosis not present

## 2021-12-16 NOTE — Progress Notes (Signed)
Follow-Up Visit   Subjective  Cristian Fernandez is a 41 y.o. male who presents for the following: Annual Exam (The patient presents for Total-Body Skin Exam (TBSE) for skin cancer screening and mole check.  The patient has spots, moles and lesions to be evaluated, some may be new or changing and the patient has concerns that these could be cancer./).  The following portions of the chart were reviewed this encounter and updated as appropriate:   Tobacco  Allergies  Meds  Problems  Med Hx  Surg Hx  Fam Hx      Review of Systems:  No other skin or systemic complaints except as noted in HPI or Assessment and Plan.  Objective  Well appearing patient in no apparent distress; mood and affect are within normal limits.  A full examination was performed including scalp, head, eyes, ears, nose, lips, neck, chest, axillae, abdomen, back, buttocks, bilateral upper extremities, bilateral lower extremities, hands, feet, fingers, toes, fingernails, and toenails. All findings within normal limits unless otherwise noted below.  low mid back, right mid back Right mid back - 0.5cm med brown thin papule with darker focus 12 o'clock to 3 o'clock  Low mid back - 0.5cm light brown thin papule with darker focus at 11 o'clock    Left Upper Cutaneous Lip 0.8cm light pink thin papule  toenails Nail thickening with subungual debris    Assessment & Plan  Nevus low mid back, right mid back  Stable  Benign-appearing.  Observation.  Call clinic for new or changing lesions.  Recommend daily use of broad spectrum spf 30+ sunscreen to sun-exposed areas.    Scar Left Upper Cutaneous Lip  Vs Nevus  Stable  Benign-appearing.  Observation.  Call clinic for new or changing lesions.  Recommend daily use of broad spectrum spf 30+ sunscreen to sun-exposed areas.    Tinea unguium toenails  > trauma changes. Consider sending nail clipping to confirm. Deferred today.  Chronic > 1 year. Bothersome.    Start Skin Medicinals Ciclopirox 8% / Itraconazole 3% / Fluconazole 3% / Terbinafine HCl 1% / Ibuprofen 2% DMSO Suspension 1-2 times daily to affected nails. Avoid getting on surrounding skin. Advised this can take months to see clearance and that it does not always work. Discussed PO therapy for higher cure rate.  Discussed   Lentigines - Scattered tan macules - Due to sun exposure - Benign-appearing, observe - Recommend daily broad spectrum sunscreen SPF 30+ to sun-exposed areas, reapply every 2 hours as needed. - Call for any changes  Seborrheic Keratoses - Stuck-on, waxy, tan-brown papules and/or plaques  - Benign-appearing - Discussed benign etiology and prognosis. - Observe - Call for any changes  Melanocytic Nevi - Tan-brown and/or pink-flesh-colored symmetric macules and papules - Benign appearing on exam today - Observation - Call clinic for new or changing moles - Recommend daily use of broad spectrum spf 30+ sunscreen to sun-exposed areas.   Hemangiomas - Red papules - Discussed benign nature - Observe - Call for any changes  Actinic Damage - Chronic condition, secondary to cumulative UV/sun exposure - diffuse scaly erythematous macules with underlying dyspigmentation - Recommend daily broad spectrum sunscreen SPF 30+ to sun-exposed areas, reapply every 2 hours as needed.  - Staying in the shade or wearing long sleeves, sun glasses (UVA+UVB protection) and wide brim hats (4-inch brim around the entire circumference of the hat) are also recommended for sun protection.  - Call for new or changing lesions.  Skin cancer screening  performed today.  Return in about 1 year (around 12/17/2022) for TBSE.  Graciella Belton, RMA, am acting as scribe for Forest Gleason, MD .   Documentation: I have reviewed the above documentation for accuracy and completeness, and I agree with the above.  Forest Gleason, MD

## 2021-12-16 NOTE — Patient Instructions (Addendum)
Start [facility] Ciclopirox 8% / Itraconazole 3% / Fluconazole 3% / Terbinafine HCl 1% / Ibuprofen 2% DMSO Suspension 1-2 times daily to affected nails.   Instructions for [facility] Medications  One or more of your medications was sent to the [facility] mail order compounding pharmacy. You will receive an email from them and can purchase the medicine through that link. It will then be mailed to your home at the address you confirmed. If for any reason you do not receive an email from them, please check your spam folder. If you still do not find the email, please let us know. [facility] phone number is [phone].  Recommend taking Heliocare sun protection supplement daily in sunny weather for additional sun protection. For maximum protection on the sunniest days, you can take up to 2 capsules of regular Heliocare OR take 1 capsule of Heliocare Ultra. For prolonged exposure (such as a full day in the sun), you can repeat your dose of the supplement 4 hours after your first dose. Heliocare can be purchased at [employer], at some [facility] or at [email or web address].    Melanoma ABCDEs  Melanoma is the most dangerous type of skin cancer, and is the leading cause of death from skin disease.  You are more likely to develop melanoma if you: Have light-colored skin, light-colored eyes, or red or blond hair Spend a lot of time in the sun Tan regularly, either outdoors or in a tanning bed Have had blistering sunburns, especially during childhood Have a close family member who has had a melanoma Have atypical moles or large birthmarks  Early detection of melanoma is key since treatment is typically straightforward and cure rates are extremely high if we catch it early.   The first sign of melanoma is often a change in a mole or a new dark spot.  The ABCDE system is a way of remembering the signs of melanoma.  A for asymmetry:  The two halves do not match. B for border:  The  edges of the growth are irregular. C for color:  A mixture of colors are present instead of an even brown color. D for diameter:  Melanomas are usually (but not always) greater than 46m - the size of a pencil eraser. E for evolution:  The spot keeps changing in size, shape, and color.  Please check your skin once per month between visits. You can use a small mirror in front and a large mirror behind you to keep an eye on the back side or your body.   If you see any new or changing lesions before your next follow-up, please call to schedule a visit.  Please continue daily skin protection including broad spectrum sunscreen SPF 30+ to sun-exposed areas, reapplying every 2 hours as needed when you're outdoors.    Due to recent changes in healthcare laws, you may see results of your pathology and/or laboratory studies on MyChart before the doctors have had a chance to review them. We understand that in some cases there may be results that are confusing or concerning to you. Please understand that not all results are received at the same time and often the doctors may need to interpret multiple results in order to provide you with the best plan of care or course of treatment. Therefore, we ask that you please give u[place]2 business days to thoroughly review all your results before contacting the office for clarification. Should we see a critical lab result, you will  be contacted sooner.   If You Need Anything After Your Visit  If you have any questions or concerns for your doctor, please call our main line at [phone] and press option 4 to reach your doctor's medical assistant. If no one answers, please leave a voicemail as directed and we will return your call as soon as possible. Messages left after 4 pm will be answered the following business day.   You may also send [place] a message via [place]. We typically respond to MyChart messages within 1-2 business days.  For prescription refills, please ask  your pharmacy to contact our office. Our fax number is [phone].  If you have an urgent issue when the clinic is closed that cannot wait until the next business day, you can page your doctor at the number below.    Please note that while we do our best to be available for urgent issues outside of office hours, we are not available 24/7.   If you have an urgent issue and are unable to reach [place], you may choose to seek medical care at your doctor's office, retail clinic, urgent care center, or emergency room.  If you have a medical emergency, please immediately call 911 or go to the emergency department.  Pager Numbers  - Dr. Nehemiah Massed: [phone]  - Dr. Laurence Ferrari: [phone]  - Dr. Nicole Kindred: [phone]  In the event of inclement weather, please call our main line at [phone] for an update on the status of any delays or closures.  Dermatology Medication Tips: Please keep the boxes that topical medications come in in order to help keep track of the instructions about where and how to use these. Pharmacies typically print the medication instructions only on the boxes and not directly on the medication tubes.   If your medication is too expensive, please contact our office at [phone] option 4 or send [place] a message through [place].   We are unable to tell what your co-pay for medications will be in advance as this is different depending on your insurance coverage. However, we may be able to find a substitute medication at lower cost or fill out paperwork to get insurance to cover a needed medication.   If a prior authorization is required to get your medication covered by your insurance company, please allow [place] 1-2 business days to complete this process.  Drug prices often vary depending on where the prescription is filled and some pharmacies may offer cheaper prices.  The website www.goodrx.com contains coupons for medications through different pharmacies. The prices here do not  account for what the cost may be with help from insurance (it may be cheaper with your insurance), but the website can give you the price if you did not use any insurance.  - You can print the associated coupon and take it with your prescription to the pharmacy.  - You may also stop by our office during regular business hours and pick up a GoodRx coupon card.  - If you need your prescription sent electronically to a different pharmacy, notify our office through [facility] or by phone at [phone] option 4.     Si Usted Necesita Algo Despus de Su Visita  Tambin puede enviarnos un mensaje a travs de Pharmacist, community. Por lo general respondemos a los mensajes de MyChart en el transcurso de 1 a 2 das hbiles.  Para renovar recetas, por favor pida a su farmacia que se ponga en contacto con nuestra oficina. Harland Dingwall de fax  es el [phone].  Si tiene un asunto urgente cuando la clnica est cerrada y que no puede esperar hasta el siguiente da hbil, puede llamar/localizar a su doctor(a) al nmero que aparece a continuacin.   Por favor, tenga en cuenta que aunque hacemos todo lo posible para estar disponibles para asuntos urgentes fuera del horario de [place], no estamos disponibles las 24 horas del da, los 7 das de la [place].   Si tiene un problema urgente y no puede comunicarse con nosotros, puede optar por buscar atencin mdica  en el consultorio de su doctor(a), en una clnica privada, en un centro de atencin urgente o en una sala de emergencias.  Si tiene Engineering geologist, por favor llame inmediatamente al 911 o vaya a la sala de emergencias.  Nmeros de bper  - Dr. Nehemiah Massed: [phone]  - Dra. Moye: [phone]  - Dra. Nicole Kindred: [phone]  En caso de inclemencias del [place], por favor llame a Johnsie Kindred principal al [phone] para una actualizacin sobre el [place] de cualquier retraso o cierre.  Consejos para la medicacin en dermatologa: Por  favor, guarde las cajas en las que vienen los medicamentos de uso tpico para ayudarle a seguir las instrucciones sobre dnde y cmo usarlos. Las farmacias generalmente imprimen las instrucciones del medicamento slo en las cajas y no directamente en los tubos del [place].   Si su medicamento es muy caro, por favor, pngase en contacto con Zigmund Daniel llamando al [phone] y presione la opcin 4 o envenos un mensaje a travs de Pharmacist, community.   No podemos decirle cul ser su copago por los medicamentos por adelantado ya que esto es diferente dependiendo de la cobertura de su seguro. Sin embargo, es posible que podamos encontrar un medicamento sustituto a Electrical engineer un formulario para que el seguro cubra el medicamento que se considera necesario.   Si se requiere una autorizacin previa para que su compaa de seguros [place] su medicamento, por favor permtanos de 1 a 2 das hbiles para completar este proceso.  Los precios de los medicamentos varan con frecuencia dependiendo del Environmental consultant de dnde se surte la receta y alguna farmacias pueden ofrecer precios ms baratos.  El sitio web www.goodrx.com tiene cupones para medicamentos de Airline pilot. Los precios aqu no tienen en cuenta lo que podra costar con la ayuda del seguro (puede ser ms barato con su seguro), pero el sitio web puede darle el precio si no utiliz Research scientist (physical sciences).  - Puede imprimir el cupn correspondiente y llevarlo con su receta a la farmacia.  - Tambin puede pasar por nuestra oficina durante el horario de atencin regular y Charity fundraiser una tarjeta de cupones de GoodRx.  - Si necesita que su receta se enve electrnicamente a una farmacia diferente, informe a nuestra oficina a travs de MyChart de [place] o por telfono llamando al [phone] y presione la opcin 4.

## 2021-12-20 ENCOUNTER — Encounter: Payer: Self-pay | Admitting: Dermatology

## 2022-06-29 ENCOUNTER — Encounter: Payer: Managed Care, Other (non HMO) | Admitting: Family Medicine

## 2022-09-24 HISTORY — PX: VASECTOMY: SHX75
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
## 2022-12-21 ENCOUNTER — Ambulatory Visit: Payer: Managed Care, Other (non HMO) | Admitting: Dermatology

## 2023-01-03 ENCOUNTER — Ambulatory Visit (INDEPENDENT_AMBULATORY_CARE_PROVIDER_SITE_OTHER): Payer: Managed Care, Other (non HMO) | Admitting: Dermatology

## 2023-01-03 ENCOUNTER — Encounter: Payer: Self-pay | Admitting: Dermatology

## 2023-01-03 VITALS — BP 119/75 | HR 60

## 2023-01-03 DIAGNOSIS — L578 Other skin changes due to chronic exposure to nonionizing radiation: Secondary | ICD-10-CM

## 2023-01-03 DIAGNOSIS — T22032A Burn of unspecified degree of left upper arm, initial encounter: Secondary | ICD-10-CM

## 2023-01-03 DIAGNOSIS — D229 Melanocytic nevi, unspecified: Secondary | ICD-10-CM

## 2023-01-03 DIAGNOSIS — W908XXA Exposure to other nonionizing radiation, initial encounter: Secondary | ICD-10-CM | POA: Diagnosis not present

## 2023-01-03 DIAGNOSIS — Z1283 Encounter for screening for malignant neoplasm of skin: Secondary | ICD-10-CM

## 2023-01-03 DIAGNOSIS — L814 Other melanin hyperpigmentation: Secondary | ICD-10-CM

## 2023-01-03 DIAGNOSIS — B351 Tinea unguium: Secondary | ICD-10-CM

## 2023-01-03 DIAGNOSIS — L821 Other seborrheic keratosis: Secondary | ICD-10-CM

## 2023-01-03 DIAGNOSIS — D1801 Hemangioma of skin and subcutaneous tissue: Secondary | ICD-10-CM

## 2023-01-03 NOTE — Patient Instructions (Addendum)
Recommend daily broad spectrum sunscreen SPF 30+ to sun-exposed areas, reapply every 2 hours as needed. Call for new or changing lesions.  Staying in the shade or wearing long sleeves, sun glasses (UVA+UVB protection) and wide brim hats (4-inch brim around the entire circumference of the hat) are also recommended for sun protection.   Melanoma ABCDEs  Melanoma is the most dangerous type of skin cancer, and is the leading cause of death from skin disease.  You are more likely to develop melanoma if you: Have light-colored skin, light-colored eyes, or red or blond hair Spend a lot of time in the sun Tan regularly, either outdoors or in a tanning bed Have had blistering sunburns, especially during childhood Have a close family member who has had a melanoma Have atypical moles or large birthmarks  Early detection of melanoma is key since treatment is typically straightforward and cure rates are extremely high if we catch it early.   The first sign of melanoma is often a change in a mole or a new dark spot.  The ABCDE system is a way of remembering the signs of melanoma.  A for asymmetry:  The two halves do not match. B for border:  The edges of the growth are irregular. C for color:  A mixture of colors are present instead of an even brown color. D for diameter:  Melanomas are usually (but not always) greater than 80mm - the size of a pencil eraser. E for evolution:  The spot keeps changing in size, shape, and color.  Please check your skin once per month between visits. You can use a small mirror in front and a large mirror behind you to keep an eye on the back side or your body.   If you see any new or changing lesions before your next follow-up, please call to schedule a visit.  Please continue daily skin protection including broad spectrum sunscreen SPF 30+ to sun-exposed areas, reapplying every 2 hours as needed when you're outdoors.   Staying in the shade or wearing long sleeves, sun  glasses (UVA+UVB protection) and wide brim hats (4-inch brim around the entire circumference of the hat) are also recommended for sun protection.
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
## 2023-02-22 ENCOUNTER — Ambulatory Visit
Admission: RE | Admit: 2023-02-22 | Discharge: 2023-02-22 | Disposition: A | Discharge status: Home / Self Care | Payer: Managed Care, Other (non HMO) | Source: Ambulatory Visit | Attending: Orthopedic Surgery | Admitting: Orthopedic Surgery

## 2023-02-22 ENCOUNTER — Other Ambulatory Visit: Payer: Self-pay | Admitting: Orthopedic Surgery

## 2023-02-22 ENCOUNTER — Encounter: Payer: Self-pay | Admitting: Orthopedic Surgery

## 2023-02-22 DIAGNOSIS — M25561 Pain in right knee: Secondary | ICD-10-CM

## 2023-03-15 ENCOUNTER — Other Ambulatory Visit: Payer: Self-pay | Admitting: Orthopedic Surgery

## 2023-04-07 ENCOUNTER — Other Ambulatory Visit: Payer: Self-pay

## 2023-04-07 ENCOUNTER — Encounter
Admission: RE | Admit: 2023-04-07 | Discharge: 2023-04-07 | Disposition: A | Discharge status: Home / Self Care | Payer: BC Managed Care – PPO | Source: Ambulatory Visit | Attending: Orthopedic Surgery | Admitting: Orthopedic Surgery

## 2023-04-07 VITALS — Ht 73.0 in | Wt 168.0 lb

## 2023-04-07 DIAGNOSIS — Z01812 Encounter for preprocedural laboratory examination: Secondary | ICD-10-CM

## 2023-04-07 NOTE — Patient Instructions (Addendum)
Your procedure is scheduled on: Friday, December 20 Report to the Registration Desk on the 1st floor of the [facility] To find out your arrival time, please call [phone] between 1PM - 3PM on: Thursday, December 19 If your arrival time is 6:00 am, do not arrive before that time as the [facility] entrance doors do not open until 6:00 am.  REMEMBER: Instructions that are not followed completely may result in serious medical risk, up to and including death; or upon the discretion of your surgeon and anesthesiologist your surgery may need to be rescheduled.  Do not eat food after midnight the night before surgery.  No gum chewing or hard candies.  You may however, drink CLEAR liquids up to 2 hours before you are scheduled to arrive for your surgery. Do not drink anything within 2 hours of your scheduled arrival time.  Clear liquids include: - water  - apple juice without pulp - gatorade (not RED colors) - black coffee or tea (Do NOT add milk or creamers to the coffee or tea) Do NOT drink anything that is not on this list.  In addition, your doctor has ordered for you to drink the provided:  Ensure Pre-Surgery Clear Carbohydrate Drink  Drinking this carbohydrate drink up to two hours before surgery helps to reduce insulin resistance and improve patient outcomes. Please complete drinking 2 hours before scheduled arrival time.  One week prior to surgery: starting December 13 Stop Anti-inflammatories (NSAIDS) such as Advil, Aleve, Ibuprofen, Motrin, Naproxen, Naprosyn and Aspirin based products such as Excedrin, Goody's Powder, BC Powder. Stop ANY and ALL OVER THE COUNTER supplements until after surgery.  You may however, continue to take Tylenol if needed for pain up until the day of surgery.  ON THE DAY OF SURGERY DO NOT TAKE ANY MEDICATIONS   No Alcohol for 24 hours before or after surgery.  No Smoking including e-cigarettes for 24 hours before surgery.  No chewable tobacco  products for at least 6 hours before surgery.  No nicotine patches on the day of surgery.  Do not use any "recreational" drugs for at least a week (preferably 2 weeks) before your surgery.  Please be advised that the combination of cocaine and anesthesia may have negative outcomes, up to and including death. If you test positive for cocaine, your surgery will be cancelled.  On the morning of surgery brush your teeth with toothpaste and water, you may rinse your mouth with mouthwash if you wish. Do not swallow any toothpaste or mouthwash.  Use CHG Soap as directed on instruction sheet.  Do not wear jewelry, make-up, hairpins, clips or nail polish.  For welded (permanent) jewelry: bracelets, anklets, waist bands, etc.  Please have this removed prior to surgery.  If it is not removed, there is a chance that hospital personnel will need to cut it off on the day of surgery.  Do not wear lotions, powders, or perfumes.   Do not shave body hair from the neck down 48 hours before surgery.  Contact lenses, hearing aids and dentures may not be worn into surgery.  Do not bring valuables to the hospital. [facility] is not responsible for any missing/lost belongings or valuables.   Notify your doctor if there is any change in your medical condition (cold, fever, infection).  Wear comfortable clothing (specific to your surgery type) to the hospital.  After surgery, you can help prevent lung complications by doing breathing exercises.  Take deep breaths and cough every 1-2  hours. Your doctor may order a device called an Incentive Spirometer to help you take deep breaths.  If you are being discharged the day of surgery, you will not be allowed to drive home. You will need a responsible individual to drive you home and stay with you for 24 hours after surgery.   If you are taking public transportation, you will need to have a responsible individual with you.  Please call the Pre-admissions Testing  Dept. at [phone] if you have any questions about these instructions.  Surgery Visitation Policy:  Patients having surgery or a procedure may have two visitors.  Children under the age of 56 must have an adult with them who is not the patient.       Preparing for Surgery with CHLORHEXIDINE GLUCONATE (CHG) Soap  Chlorhexidine Gluconate (CHG) Soap  o An antiseptic cleaner that kills germs and bonds with the skin to continue killing germs even after washing  o Used for showering the night before surgery and morning of surgery  Before surgery, you can play an important role by reducing the number of germs on your skin.  CHG (Chlorhexidine gluconate) soap is an antiseptic cleanser which kills germs and bonds with the skin to continue killing germs even after washing.  Please do not use if you have an allergy to CHG or antibacterial soaps. If your skin becomes reddened/irritated stop using the CHG.  1. Shower the NIGHT BEFORE SURGERY and the MORNING OF SURGERY with CHG soap.  2. If you choose to wash your hair, wash your hair first as usual with your normal shampoo.  3. After shampooing, rinse your hair and body thoroughly to remove the shampoo.  4. Use CHG as you would any other liquid soap. You can apply CHG directly to the skin and wash gently with a scrungie or a clean washcloth.  5. Apply the CHG soap to your body only from the neck down. Do not use on open wounds or open sores. Avoid contact with your eyes, ears, mouth, and genitals (private parts). Wash face and genitals (private parts) with your normal soap.  6. Wash thoroughly, paying special attention to the area where your surgery will be performed.  7. Thoroughly rinse your body with warm water.  8. Do not shower/wash with your normal soap after using and rinsing off the CHG soap.  9. Pat yourself dry with a clean towel.  10. Wear clean pajamas to bed the night before surgery.  12. Place clean sheets on your  bed the night of your first shower and do not sleep with pets.  13. Shower again with the CHG soap on the day of surgery prior to arriving at the hospital.  14. Do not apply any deodorants/lotions/powders.  15. Please wear clean clothes to the hospital.

## 2023-04-10 ENCOUNTER — Inpatient Hospital Stay
Admission: RE | Admit: 2023-04-10 | Discharge: 2023-04-10 | Discharge status: Home / Self Care | Payer: BC Managed Care – PPO | Source: Ambulatory Visit | Attending: Orthopedic Surgery

## 2023-04-10 DIAGNOSIS — Z01812 Encounter for preprocedural laboratory examination: Secondary | ICD-10-CM | POA: Diagnosis present

## 2023-04-10 LAB — CBC
HCT: 43.6 % (ref 39.0–52.0)
Hemoglobin: 15 g/dL (ref 13.0–17.0)
MCH: 30.1 pg (ref 26.0–34.0)
MCHC: 34.4 g/dL (ref 30.0–36.0)
MCV: 87.6 fL (ref 80.0–100.0)
Platelets: 186 10*3/uL (ref 150–400)
RBC: 4.98 MIL/uL (ref 4.22–5.81)
RDW: 11.9 % (ref 11.5–15.5)
WBC: 4.6 10*3/uL (ref 4.0–10.5)
nRBC: 0 % (ref 0.0–0.2)

## 2023-04-10 LAB — BASIC METABOLIC PANEL
Anion gap: 7 (ref 5–15)
BUN: 20 mg/dL (ref 6–20)
CO2: 28 mmol/L (ref 22–32)
Calcium: 9.1 mg/dL (ref 8.9–10.3)
Chloride: 105 mmol/L (ref 98–111)
Creatinine, Ser: 1.05 mg/dL (ref 0.61–1.24)
GFR, Estimated: >60 mL/min (ref >60)
Glucose, Bld: 88 mg/dL (ref 70–99)
Potassium: 4.5 mmol/L (ref 3.5–5.1)
Sodium: 140 mmol/L (ref 135–145)

## 2023-04-13 MED ORDER — ORAL CARE MOUTH RINSE: 15.0000 mL | Freq: Once | OROMUCOSAL | Status: AC

## 2023-04-13 MED ORDER — CHLORHEXIDINE GLUCONATE 0.12 % MT SOLN
15.0000 mL | Freq: Once | OROMUCOSAL | Status: AC
  Administered 2023-04-14: 15 mL via OROMUCOSAL

## 2023-04-13 MED ORDER — LACTATED RINGERS IV SOLN: INTRAVENOUS | Status: DC

## 2023-04-13 MED ORDER — CEFAZOLIN SODIUM-DEXTROSE 2-4 GM/100ML-% IV SOLN
2.0000 g | INTRAVENOUS | Status: AC
  Administered 2023-04-14: 2 g via INTRAVENOUS

## 2023-04-14 ENCOUNTER — Other Ambulatory Visit: Payer: Self-pay

## 2023-04-14 ENCOUNTER — Ambulatory Visit
Admission: RE | Admit: 2023-04-14 | Discharge: 2023-04-14 | Disposition: A | Discharge status: Home / Self Care | Payer: BC Managed Care – PPO | Attending: Orthopedic Surgery | Admitting: Orthopedic Surgery

## 2023-04-14 ENCOUNTER — Encounter: Payer: Self-pay | Admitting: Orthopedic Surgery

## 2023-04-14 ENCOUNTER — Ambulatory Visit: Payer: BC Managed Care – PPO | Admitting: Anesthesiology

## 2023-04-14 ENCOUNTER — Ambulatory Visit: Payer: BC Managed Care – PPO | Admitting: Urgent Care

## 2023-04-14 ENCOUNTER — Encounter
Admission: RE | Disposition: A | Discharge status: Home / Self Care | Payer: Self-pay | Source: Home / Self Care | Attending: Orthopedic Surgery

## 2023-04-14 DIAGNOSIS — S83241A Other tear of medial meniscus, current injury, right knee, initial encounter: Secondary | ICD-10-CM | POA: Insufficient documentation

## 2023-04-14 DIAGNOSIS — X58XXXA Exposure to other specified factors, initial encounter: Secondary | ICD-10-CM | POA: Insufficient documentation

## 2023-04-14 DIAGNOSIS — Z87891 Personal history of nicotine dependence: Secondary | ICD-10-CM | POA: Diagnosis not present

## 2023-04-14 HISTORY — PX: KNEE ARTHROSCOPY WITH MENISCAL REPAIR: SHX5653

## 2023-04-14 SURGERY — ARTHROSCOPY, KNEE, WITH MENISCUS REPAIR
Anesthesia: General | Site: Knee | Laterality: Right

## 2023-04-14 MED ORDER — LIDOCAINE-EPINEPHRINE 1 %-1:100000 IJ SOLN
INTRAMUSCULAR | Status: AC
  Filled 2023-04-14: qty 1

## 2023-04-14 MED ORDER — LIDOCAINE HCL (PF) 2 % IJ SOLN
INTRAMUSCULAR | Status: AC
  Filled 2023-04-14: qty 5

## 2023-04-14 MED ORDER — OXYCODONE HCL 5 MG PO TABS
5.0000 mg | ORAL_TABLET | Freq: Once | ORAL | Status: AC | PRN
  Administered 2023-04-14: 5 mg via ORAL

## 2023-04-14 MED ORDER — ONDANSETRON HCL 4 MG/2ML IJ SOLN: 4.0000 mg | Freq: Once | INTRAMUSCULAR | Status: DC | PRN

## 2023-04-14 MED ORDER — DEXAMETHASONE SODIUM PHOSPHATE 10 MG/ML IJ SOLN
INTRAMUSCULAR | Status: DC | PRN
  Administered 2023-04-14: 10 mg via INTRAVENOUS

## 2023-04-14 MED ORDER — DEXMEDETOMIDINE HCL IN NACL 200 MCG/50ML IV SOLN
INTRAVENOUS | Status: DC | PRN
  Administered 2023-04-14 (×2): 4 ug via INTRAVENOUS

## 2023-04-14 MED ORDER — DEXAMETHASONE SODIUM PHOSPHATE 10 MG/ML IJ SOLN
INTRAMUSCULAR | Status: AC
  Filled 2023-04-14: qty 1

## 2023-04-14 MED ORDER — ACETAMINOPHEN 10 MG/ML IV SOLN: 1000.0000 mg | Freq: Once | INTRAVENOUS | Status: DC | PRN

## 2023-04-14 MED ORDER — ONDANSETRON HCL 4 MG/2ML IJ SOLN
INTRAMUSCULAR | Status: AC
  Filled 2023-04-14: qty 2

## 2023-04-14 MED ORDER — SUCCINYLCHOLINE CHLORIDE 200 MG/10ML IV SOSY
PREFILLED_SYRINGE | INTRAVENOUS | Status: AC
  Filled 2023-04-14: qty 10

## 2023-04-14 MED ORDER — ACETAMINOPHEN 500 MG PO TABS: 1000.0000 mg | ORAL_TABLET | Freq: Three times a day (TID) | ORAL | 2 refills | Status: DC

## 2023-04-14 MED ORDER — FENTANYL CITRATE (PF) 100 MCG/2ML IJ SOLN
INTRAMUSCULAR | Status: DC | PRN
  Administered 2023-04-14: 50 ug via INTRAVENOUS

## 2023-04-14 MED ORDER — ONDANSETRON 4 MG PO TBDP: 4.0000 mg | ORAL_TABLET | Freq: Three times a day (TID) | ORAL | 0 refills | Status: AC | PRN

## 2023-04-14 MED ORDER — MIDAZOLAM HCL 2 MG/2ML IJ SOLN
INTRAMUSCULAR | Status: AC
  Filled 2023-04-14: qty 2

## 2023-04-14 MED ORDER — PROPOFOL 10 MG/ML IV BOLUS
INTRAVENOUS | Status: DC | PRN
  Administered 2023-04-14: 200 mg via INTRAVENOUS

## 2023-04-14 MED ORDER — CEFAZOLIN SODIUM-DEXTROSE 2-4 GM/100ML-% IV SOLN
INTRAVENOUS | Status: AC
  Filled 2023-04-14: qty 100

## 2023-04-14 MED ORDER — IBUPROFEN 800 MG PO TABS: 800.0000 mg | ORAL_TABLET | Freq: Three times a day (TID) | ORAL | 0 refills | Status: AC

## 2023-04-14 MED ORDER — RINGERS IRRIGATION IR SOLN
Status: DC | PRN
  Administered 2023-04-14: 9000 mL

## 2023-04-14 MED ORDER — SUCCINYLCHOLINE CHLORIDE 200 MG/10ML IV SOSY
PREFILLED_SYRINGE | INTRAVENOUS | Status: DC | PRN
  Administered 2023-04-14: 100 mg via INTRAVENOUS

## 2023-04-14 MED ORDER — BUPIVACAINE HCL (PF) 0.5 % IJ SOLN
INTRAMUSCULAR | Status: AC
  Filled 2023-04-14: qty 30

## 2023-04-14 MED ORDER — PROPOFOL 10 MG/ML IV BOLUS
INTRAVENOUS | Status: AC
  Filled 2023-04-14: qty 20

## 2023-04-14 MED ORDER — ONDANSETRON HCL 4 MG/2ML IJ SOLN
INTRAMUSCULAR | Status: DC | PRN
  Administered 2023-04-14: 4 mg via INTRAVENOUS

## 2023-04-14 MED ORDER — ASPIRIN 325 MG PO TBEC: 325.0000 mg | DELAYED_RELEASE_TABLET | Freq: Every day | ORAL | 0 refills | Status: AC

## 2023-04-14 MED ORDER — CHLORHEXIDINE GLUCONATE 0.12 % MT SOLN
OROMUCOSAL | Status: AC
  Filled 2023-04-14: qty 15

## 2023-04-14 MED ORDER — MIDAZOLAM HCL 2 MG/2ML IJ SOLN
INTRAMUSCULAR | Status: DC | PRN
  Administered 2023-04-14: 2 mg via INTRAVENOUS

## 2023-04-14 MED ORDER — ACETAMINOPHEN 10 MG/ML IV SOLN
INTRAVENOUS | Status: AC
  Filled 2023-04-14: qty 100

## 2023-04-14 MED ORDER — OXYCODONE HCL 5 MG PO TABS
ORAL_TABLET | ORAL | Status: AC
  Filled 2023-04-14: qty 1

## 2023-04-14 MED ORDER — HYDROCODONE-ACETAMINOPHEN 5-325 MG PO TABS: 1.0000 | ORAL_TABLET | ORAL | 0 refills | Status: DC | PRN

## 2023-04-14 MED ORDER — LACTATED RINGERS IV SOLN
INTRAVENOUS | Status: DC | PRN
  Administered 2023-04-14: 3001 mL

## 2023-04-14 MED ORDER — EPINEPHRINE PF 1 MG/ML IJ SOLN
INTRAMUSCULAR | Status: AC
  Filled 2023-04-14: qty 1

## 2023-04-14 MED ORDER — FENTANYL CITRATE (PF) 100 MCG/2ML IJ SOLN: 25.0000 ug | INTRAMUSCULAR | Status: DC | PRN

## 2023-04-14 MED ORDER — OXYCODONE HCL 5 MG/5ML PO SOLN
5.0000 mg | Freq: Once | ORAL | Status: AC | PRN
Start: 2023-04-14 — End: 2023-04-14

## 2023-04-14 MED ORDER — LIDOCAINE-EPINEPHRINE 1 %-1:100000 IJ SOLN
INTRAMUSCULAR | Status: DC | PRN
  Administered 2023-04-14: 2 mL via INTRAMUSCULAR
  Administered 2023-04-14: 3 mL via INTRAMUSCULAR

## 2023-04-14 MED ORDER — ACETAMINOPHEN 10 MG/ML IV SOLN
INTRAVENOUS | Status: DC | PRN
  Administered 2023-04-14: 1000 mg via INTRAVENOUS

## 2023-04-14 MED ORDER — FENTANYL CITRATE (PF) 100 MCG/2ML IJ SOLN
INTRAMUSCULAR | Status: AC
  Filled 2023-04-14: qty 2

## 2023-04-14 MED ORDER — LIDOCAINE HCL (CARDIAC) PF 100 MG/5ML IV SOSY
PREFILLED_SYRINGE | INTRAVENOUS | Status: DC | PRN
  Administered 2023-04-14: 80 mg via INTRAVENOUS

## 2023-04-14 SURGICAL SUPPLY — 57 items
ADAPTER IRRIG TUBE 2 SPIKE SOL (ADAPTER) ×2 IMPLANT
BLADE FULL RADIUS 3.5 (BLADE) ×1 IMPLANT
BLADE SHAVER 4.5X7 STR FR (MISCELLANEOUS) ×1 IMPLANT
BLADE SURG SZ10 CARB STEEL (BLADE) ×1 IMPLANT
BLADE SURG SZ11 CARB STEEL (BLADE) ×1 IMPLANT
BNDG COHESIVE 6X5 TAN ST LF (GAUZE/BANDAGES/DRESSINGS) ×1 IMPLANT
BNDG ELASTIC 6INX 5YD STR LF (GAUZE/BANDAGES/DRESSINGS) ×1 IMPLANT
BNDG ESMARCH 6X12 STRL LF (GAUZE/BANDAGES/DRESSINGS) ×1 IMPLANT
BRUSH SCRUB EZ 4% CHG (MISCELLANEOUS) ×1 IMPLANT
BUR BR 5.5 WIDE MOUTH (BURR) IMPLANT
CARTRIDGE SUT 2-0 NONSTITCH (Anchor) IMPLANT
CHLORAPREP W/TINT 26 (MISCELLANEOUS) ×1 IMPLANT
COOLER POLAR GLACIER W/PUMP (MISCELLANEOUS) ×1 IMPLANT
COVER LIGHT HANDLE STERIS (MISCELLANEOUS) ×2 IMPLANT
CUFF TRNQT CYL 24X4X16.5-23 (TOURNIQUET CUFF) IMPLANT
CUFF TRNQT CYL 34X4.125X (TOURNIQUET CUFF) IMPLANT
DERMABOND ADVANCED .7 DNX12 (GAUZE/BANDAGES/DRESSINGS) ×1 IMPLANT
DEVICE SUCT BLK HOLE OR FLOOR (MISCELLANEOUS) ×1 IMPLANT
DRAPE ARTHROSCOPY W/POUCH 90 (DRAPES) ×2 IMPLANT
DRAPE IMP U-DRAPE 54X76 (DRAPES) ×1 IMPLANT
ELECT REM PT RETURN 9FT ADLT (ELECTROSURGICAL)
ELECTRODE REM PT RTRN 9FT ADLT (ELECTROSURGICAL) ×1 IMPLANT
GAUZE SPONGE 4X4 12PLY STRL (GAUZE/BANDAGES/DRESSINGS) ×1 IMPLANT
GLOVE BIOGEL PI IND STRL 8 (GLOVE) ×1 IMPLANT
GLOVE ORTHO TXT STRL SZ7.5 (GLOVE) ×1 IMPLANT
GLOVE SURG ORTHO 8.0 STRL STRW (GLOVE) ×2 IMPLANT
GLOVE SURG SYN 7.5 E (GLOVE) ×1
GLOVE SURG SYN 7.5 PF PI (GLOVE) ×1 IMPLANT
GOWN STRL REUS W/ TWL LRG LVL3 (GOWN DISPOSABLE) ×1 IMPLANT
GOWN STRL REUS W/ TWL XL LVL3 (GOWN DISPOSABLE) ×2 IMPLANT
IV LR IRRIG 3000ML ARTHROMATIC (IV SOLUTION) ×4 IMPLANT
KIT TURNOVER KIT A (KITS) ×1 IMPLANT
MANAGER SUT NOVOCUT (CUTTER) IMPLANT
MANIFOLD NEPTUNE II (INSTRUMENTS) ×2 IMPLANT
MAT ABSORB FLUID 56X50 GRAY (MISCELLANEOUS) ×2 IMPLANT
NOVOSTICH PRO MENISCAL 2-0 (Miscellaneous) ×1 IMPLANT
PACK ARTHROSCOPY KNEE (MISCELLANEOUS) ×1 IMPLANT
PAD ABD DERMACEA PRESS 5X9 (GAUZE/BANDAGES/DRESSINGS) ×2 IMPLANT
PAD WRAPON POLAR KNEE (MISCELLANEOUS) ×1 IMPLANT
PADDING CAST COTTON 6X4 NS (MISCELLANEOUS) ×1 IMPLANT
PADDING CAST COTTON 6X4 STRL (CAST SUPPLIES) ×1 IMPLANT
SLEEVE REMOTE CONTROL 5X12 (DRAPES) IMPLANT
SPONGE T-LAP 18X18 ~~LOC~~+RFID (SPONGE) ×1 IMPLANT
SUT ETHILON 3-0 FS-10 30 BLK (SUTURE) ×1
SUT MNCRL 4-0 27XMFL (SUTURE)
SUT VIC AB 2-0 CT2 27 (SUTURE) ×1 IMPLANT
SUTURE EHLN 3-0 FS-10 30 BLK (SUTURE) ×1 IMPLANT
SUTURE MNCRL 4-0 27XMF (SUTURE) ×1 IMPLANT
SYSTEM NVSTCH PRO MENISCAL 2-0 (Miscellaneous) IMPLANT
TAPE TRANSPORE STRL 2 31045 (GAUZE/BANDAGES/DRESSINGS) ×1 IMPLANT
TOWEL OR 17X26 4PK STRL BLUE (TOWEL DISPOSABLE) ×2 IMPLANT
TRAP FLUID SMOKE EVACUATOR (MISCELLANEOUS) ×1 IMPLANT
TUBE SET DOUBLEFLO INFLOW (TUBING) ×1 IMPLANT
TUBE SET DOUBLEFLO OUTFLOW (TUBING) ×1 IMPLANT
WAND WEREWOLF FLOW 90D (MISCELLANEOUS) ×1 IMPLANT
WATER STERILE IRR 500ML POUR (IV SOLUTION) ×1 IMPLANT
WRAPON POLAR PAD KNEE (MISCELLANEOUS) ×1

## 2023-04-14 NOTE — Transfer of Care (Signed)
Immediate Anesthesia Transfer of Care Note  Patient: Cristian Fernandez  Procedure(s) Performed: Right arthroscopic medial meniscus repair, possible chondroplasty (Right: Knee)  Patient Location: PACU  Anesthesia Type:General  Level of Consciousness: sedated  Airway & Oxygen Therapy: Patient Spontanous Breathing and Patient connected to face mask oxygen  Post-op Assessment: Report given to RN and Post -op Vital signs reviewed and stable  Post vital signs: Reviewed and stable  Last Vitals:  Vitals Value Taken Time  BP 106/76 04/14/23 0900  Temp    Pulse 80 04/14/23 0900  Resp 15 04/14/23 0900  SpO2 95 % 04/14/23 0900    Last Pain:  Vitals:   04/14/23 0631  TempSrc: Temporal  PainSc: 10-Worst pain ever      Patients Stated Pain Goal: 0 (04/14/23 0631)  Complications: No notable events documented.

## 2023-04-14 NOTE — Op Note (Signed)
Operative Note    SURGERY DATE:04/14/2023      PRE-OP DIAGNOSIS:  1. Right medial meniscus tear   POST-OP DIAGNOSIS:  1. Right medial meniscus tear     PROCEDURES:  1. Right knee arthroscopy, medial meniscus repair (horizontal tear, posterior horn)  SURGEON: Rosealee Albee, MD  ASSISTANT: Sonny Dandy, PA    ANESTHESIA: Gen   ESTIMATED BLOOD LOSS: minimal   TOTAL IV FLUIDS: per anesthesia   INDICATION(S): The patient is a 42 y.o. male  with clinical exam and MRI that were consistent with a medial meniscus tear that had failed nonoperative management. After discussion of risks, benefits, and alternatives to surgery, the patient elected to proceed.  The patient understands that there is a risk of re-tear with meniscus repair, but would prefer repair to maintain the normal biomechanics and structure of the knee. The patient is willing to perform the appropriate rehab and maintain weight-bearing restrictions post-operatively.   OPERATIVE FINDINGS:    Examination under anesthesia: A careful examination under anesthesia was performed.  Passive range of motion was: Hyperextension: 0.  Extension: 2.  Flexion: 140.  Lachman: normal. Pivot Shift: normal.  Posterior drawer: normal.  Varus stability in full extension: normal.  Varus stability in 30 degrees of flexion: normal.  Valgus stability in full extension: normal.  Valgus stability in 30 degrees of flexion: normal.   Intra-operative findings: A thorough arthroscopic examination of the knee was performed.  The findings are: 1. Suprapatellar pouch: Normal 2. Undersurface of median ridge: Grade 1 softening 3. Medial patellar facet: Normal  4. Lateral patellar facet: Normal 5. Trochlea: Normal 6. Lateral gutter/popliteus tendon: Normal 7. Hoffa's fat pad: Normal 8. Medial gutter/plica: Normal 9. ACL: Intact  10. PCL: Normal 11. Medial meniscus: horizontal tear of the entire posterior horn 12. Medial compartment cartilage: Scattered  grade 1 changes to femoral condyle and tibial plateau, otherwise normal 13. Lateral meniscus: Normal 14. Lateral compartment cartilage: Normal femoral condyle, grade 1 softening of central tibial plateau   OPERATIVE REPORT:     I identified the patient in the pre-operative holding area.  I marked the operative knee with my initials. I reviewed the risks and benefits of the proposed surgical intervention, and the patient (and/or patient's guardian) wished to proceed. The patient was transferred to the operative suite and placed in the supine position with all bony prominences padded.  Anesthesia was administered. Appropriate IV antibiotics were administered within 30 minutes of incision. The extremity was then prepped and draped in standard fashion. A time out was performed confirming the correct extremity, correct patient, and correct procedure.   Arthroscopy portals were marked. Local anesthetic was injected to the planned portal sites. The anterolateral portal was established with an 11 blade. The arthroscope was placed in the anterolateral portal and then into the suprapatellar pouch. Next, the medial portal was established under needle localization. A spinal needle was used to pie-crust the MCL to allow for better visualization and protect the cartilage surfaces during instrumentation. A diagnostic knee scope was completed with the above findings.    The central 3mm of the posterior horn was removed with an arthroscopic biter to better access the tear and allow for appropriate suture passage. The edges of the meniscus tear and capsule were roughened with a rasp and shaver to create a more optimal healing surface.  Ceterix Novostitch x4 all-inside sutures were passed in a mattress fashion across the posterior horn along the length of the tear. Arthroscopic knots were tied  for each suture, allowing for anatomic reduction. An additional 2 sutures were passed in a similar fashion after switching portals at  the posterior horn/body juction.  A total of 6 Ceterix Novostitch stitches were used.  Afterwards, the meniscus was probed and felt to be anatomically reduced and stable. Microfracture of the intercondylar notch was then performed to allow for improved meniscus healing. Arthroscopic fluid was removed from the joint.   The portals were closed with 3-0 Nylon suture. Sterile dressings included Xeroform, 4x4s, Sof-Rol, and Bias wrap. Tourniquet was released with time of 52 minutes. A Polarcare was placed. A T-scope hinged knee brace was applied. The patient was then awakened and taken to the PACU hemodynamically stable without complication.   Of note, assistance from a PA was essential to performing the surgery.  PA was present for the entire surgery.  PA assisted with patient positioning, retraction, instrumentation, and wound closure. The surgery would have been more difficult and had longer operative time without PA assistance.     POSTOPERATIVE PLAN: The patient will be discharged home today once they meet PACU criteria. Aspirin 325 mg daily was prescribed for 4 weeks for DVT prophylaxis.  Physical therapy will start on POD#3-4. FFWB x 4 weeks. F/U in 2 weeks.

## 2023-04-14 NOTE — Anesthesia Preprocedure Evaluation (Signed)
Anesthesia Evaluation  Patient identified by MRN, date of birth, ID band Patient awake  General Assessment Comment:Patient says it "takes a lot to put me down." And that he is a "fast metabolizer of novacaine"  Reviewed: Allergy & Precautions, NPO status , Patient's Chart, lab work & pertinent test results  History of Anesthesia Complications Negative for: history of anesthetic complications  Airway Mallampati: III  TM Distance: >3 FB Neck ROM: Full    Dental no notable dental hx. (+) Teeth Intact   Pulmonary neg sleep apnea, pneumonia, resolved, neg COPD, Patient abstained from smoking.Not current smoker, former smoker Has had some difficulty since his covid pneumonia in 2021. Not on inhalers or oxygen, breathing feels good today  Occasional cigar smoker   Pulmonary exam normal breath sounds clear to auscultation       Cardiovascular Exercise Tolerance: Good METS(-) hypertension(-) CAD and (-) Past MI negative cardio ROS (-) dysrhythmias  Rhythm:Regular Rate:Normal - Systolic murmurs    Neuro/Psych negative neurological ROS  negative psych ROS   GI/Hepatic ,neg GERD  ,,(+)     (-) substance abuse    Endo/Other  neg diabetes    Renal/GU negative Renal ROS     Musculoskeletal   Abdominal   Peds  Hematology   Anesthesia Other Findings Past Medical History: 1999: Adverse effect of anesthesia     Comment:  was hard to wake up after wisdom teeth No date: Allergic rhinitis No date: Knee injury     Comment:  R knee injury at 42 years old, recurrent effusions 04/2019: Pneumonia     Comment:  double pneumonia   Reproductive/Obstetrics                             Anesthesia Physical Anesthesia Plan  ASA: 2  Anesthesia Plan: General   Post-op Pain Management: Ofirmev IV (intra-op)* and Toradol IV (intra-op)*   Induction: Intravenous  PONV Risk Score and Plan: Ondansetron,  Dexamethasone and Midazolam  Airway Management Planned: LMA  Additional Equipment: None  Intra-op Plan:   Post-operative Plan: Extubation in OR  Informed Consent: I have reviewed the patients History and Physical, chart, labs and discussed the procedure including the risks, benefits and alternatives for the proposed anesthesia with the patient or authorized representative who has indicated his/her understanding and acceptance.     Dental advisory given  Plan Discussed with: CRNA and Surgeon  Anesthesia Plan Comments: (Discussed risks of anesthesia with patient, including PONV, sore throat, lip/dental/eye damage. Rare risks discussed as well, such as cardiorespiratory and neurological sequelae, and allergic reactions. Discussed the role of CRNA in patient's perioperative care. Patient understands.)       Anesthesia Quick Evaluation

## 2023-04-14 NOTE — Anesthesia Procedure Notes (Signed)
Procedure Name: Intubation Date/Time: 04/14/2023 7:40 AM  Performed by: Reece Agar, CRNAPre-anesthesia Checklist: Patient identified, Emergency Drugs available, Suction available and Patient being monitored Patient Re-evaluated:Patient Re-evaluated prior to induction Oxygen Delivery Method: Circle system utilized Preoxygenation: Pre-oxygenation with 100% oxygen Induction Type: IV induction Ventilation: Mask ventilation without difficulty Laryngoscope Size: McGrath and 4 Grade View: Grade I Tube type: Oral Number of attempts: 1 Airway Equipment and Method: Stylet Placement Confirmation: ETT inserted through vocal cords under direct vision, positive ETCO2 and breath sounds checked- equal and bilateral Secured at: 22 cm Tube secured with: Tape Dental Injury: Teeth and Oropharynx as per pre-operative assessment

## 2023-04-14 NOTE — Anesthesia Postprocedure Evaluation (Signed)
Anesthesia Post Note  Patient: [name]  Procedure(s) Performed: Right arthroscopic medial meniscus repair, possible chondroplasty (Right: Knee)  Patient location during evaluation: PACU Anesthesia Type: General Level of consciousness: awake and alert Pain management: pain level controlled Vital Signs Assessment: post-procedure vital signs reviewed and stable Respiratory status: spontaneous breathing, nonlabored ventilation, respiratory function stable and patient connected to nasal cannula oxygen Cardiovascular status: blood pressure returned to baseline and stable Postop Assessment: no apparent nausea or vomiting Anesthetic complications: no   No notable events documented.   Last Vitals:  Vitals:   04/14/23 0917 04/14/23 0933  BP:  (!) 134/97  Pulse: 77 68  Resp: 15 16  Temp: (!) 36.1 C   SpO2: 97% 99%    Last Pain:  Vitals:   04/14/23 0933  TempSrc:   PainSc: 1                  Corinda Gubler

## 2023-04-14 NOTE — Discharge Instructions (Signed)
Arthroscopic Knee Surgery - Meniscus Repair   Post-Op Instructions   1. Bracing or crutches: Crutches will be provided at the time of discharge from the surgery center. Keep brace locked in extension at all times except as directed by physical therapy.    2. Ice: You may be provided with a device [facility]) that allows you to ice the affected area effectively. Otherwise you can ice manually.    3. Driving:  Plan on not driving for at least four weeks. Please note that you are advised NOT to drive while taking narcotic pain medications as you may be impaired and unsafe to drive.   4. Activity: Ankle pumps several times an hour while awake to prevent blood clots. Weight bearing: FLAT FOOT WEIGHT BEARING (WEIGHT OF LEG ONLY) FOR 4 WEEKS. Use crutches for at least 4 weeks, if not 6 based on your surgery. Bending and straightening the knee is unlimited, but do not flex your knee past 90 degrees until cleared by your therapist. Elevate knee above heart level as much as possible for one week. Avoid standing more than 5 minutes (consecutively) for the first week. No exercise involving the knee until cleared by the surgeon or physical therapist.  Avoid long distance travel for 4 weeks.   5. Medications:  - You have been provided a prescription for narcotic pain medicine. After surgery, take 1-2 narcotic tablets every 4 hours if needed for severe pain. If it has tylenol (acetaminophen), please do not take a total of more than 3000mg /day of tylenol.  - A prescription for anti-nausea medication will be provided in case the narcotic medicine causes nausea - take 1 tablet every 6 hours only if nauseated.  - Take ibuprofen 800 mg every 8 hours with food to reduce post-operative knee swelling. DO NOT STOP IBUPROFEN POST-OP UNTIL INSTRUCTED TO DO SO at first post-op office visit (10-14 days after surgery).  - Take enteric coated aspirin 325 mg once daily for 4 weeks to prevent blood clots.  -Take tylenol 1000  every 8 hours for pain.  May stop tylenol 3 days after surgery or when you are having minimal pain. If your narcotic has tylenol (acetaminophen), please do not take a total of more than 3000mg /day of tylenol.    If you are taking prescription medication for anxiety, depression, insomnia, muscle spasm, chronic pain, or for attention deficit disorder you are advised that you are at a higher risk of adverse effects with use of narcotics post-op, including narcotic addiction/dependence, depressed breathing, death. If you use non-prescribed substances: alcohol, marijuana, cocaine, heroin, methamphetamines, etc., you are at a higher risk of adverse effects with use of narcotics post-op, including narcotic addiction/dependence, depressed breathing, death. You are advised that taking > 50 morphine milligram equivalents (MME) of narcotic pain medication per day results in twice the risk of overdose or death. For your prescription provided: oxycodone 5 mg - taking more than 6 tablets per day. Be advised that we will prescribe narcotics short-term, for acute post-operative pain only - 1 week for minor operations such as knee arthroscopy for meniscus tear resection, and 3 weeks for major operations such as knee repair/reconstruction surgeries.   6. Bandages: The physical therapist should change the bandages at the first post-op appointment. If needed, the dressing supplies have been provided to you. You may shower after this with waterproof bandaids covering the incisions.    7. Physical Therapy: 2 times per week for the first 4 weeks, then 1-2 times per week from  weeks 4-8 post-op. Therapy typically starts on post operative Day 3 or 4. You have been provided an order for physical therapy. The therapist will provide home exercises.   8. Work/School: May do light duty/desk job/return to school in approximately 1-2 weeks when off of narcotics, pain is well-controlled, and swelling has decreased. More labor intensive  jobs may require anywhere from 6 weeks to 3-5 months to fully return. Please discuss with Dr. Allena Katz, if you have not already.   9. Post-Op Appointments: Your first post-op appointment will be with Dr. Allena Katz in approximately 2 weeks time.    If you find that they have not been scheduled please call the Orthopaedic Appointment front desk at [phone].

## 2023-04-14 NOTE — H&P (Signed)
Paper H&P to be scanned into permanent record. H&P reviewed. No significant changes noted.  

## 2023-04-17 ENCOUNTER — Encounter: Payer: Self-pay | Admitting: Orthopedic Surgery

## 2023-08-16 NOTE — Progress Notes (Addendum)
 ORTHOPEDIC SURGERY - SHOULDER EVALUATION  Chief Complaint: Chief Complaint  Patient presents with  . Hip Pain    Right hip pain   04/09/20: PROCEDURES:  1. Right shoulder arthroscopic posterior labral repair and capsulorraphy 2. Right shoulder arthroscopic rotator cuff repair (subscapularis) 3. Right shoulder arthroscopic extensive glenohumeral debridement  10/16/20: PROCEDURES: 1. Right shoulder capsular releases, lysis of adhesions, manipulation under anesthesia  2. Right subacromial decompression without acromioplasty  3. Right biceps tenotomy 4. Right glenohumeral and subacromial injections with corticosteroid  04/14/23: PROCEDURES:  1. Right knee arthroscopy, medial meniscus repair (horizontal tear, posterior horn)   History of Present Illness: 08/16/23: As last visit with me, he appeared to be developing a right pes anserine bursitis.  He underwent ultrasound-guided corticosteroid injection to this region by Dr. Sharrie on 07/28/2023.  This did improve his symptoms.  With continued physical therapy, he was noted to have increased pain with rotational maneuvers of the hip in the groin region.  His physical therapist discussed these findings with me, and he presents today for evaluation of this.   07/21/23: He is now over 3 months after undergoing above procedures.  He has been making good progress with physical therapy.  Range of motion is improving.  Gait has normalized.  Preoperative pain has improved.  However he does note significant pain about the anteromedial aspect of his tibia.   05/25/23: The patient is now approximately 6 weeks after undergoing above procedures.  No significant pain at baseline.  The patient has been attending physical therapy and has progressed with weightbearing.  Patient is wearing the brace as instructed.  Preop pain has significantly improved.  He does have some mild tightness sensations about the medial aspect when he first attempts to  weight-bear, but this quickly goes away.  He has been ambulating with the brace locked without crutches.   04/10/23: Returns in advance of surgery.  Symptoms are essentially unchanged. He continues to feel medial sided knee pain when he is going up and down stairs and performing certain twisting type motions of the knee.  Preoperative CBC and BMP from today were reviewed.  Results were all within normal limits.   03/02/23: He returns after obtaining an MRI of the knee.  He states that his pain continues to be located about the medial aspect of the knee.  He is unable to maintain his desired activity level.  02/16/23: I last saw him over 2 years ago after he had undergone above procedures.  His shoulder continues to do quite well other than some occasional soreness if he is more active than usual.  He is quite pleased with the result of his shoulder surgeries.  Today, he presents for evaluation of primarily right knee pain, although he feels the left one is a little more achy due to compensation.  He rates his pain as a 2/10 at baseline but a 10/10 at its worst.  He states he has had 3 prior injuries to the right knee, with each requiring bracing for 6 weeks dating back to when he was a teenager.  He has had persistent pain in the knee since that time, but has been relatively tolerable.  However, more recently, pain has become more persistent.  It is primary located over the medial aspect of the knee.  He has difficulty bending down and going from a deep seated position to standing.  He also notes that if he twists his knee in a certain way, he can feel a catching sensation  along the medial aspect.  He does not have any recent swelling.  He does note that he has a patch of complete numbness over his patella.  He has been taking NSAIDs/Tylenol  for over 6 weeks without improvement of the symptoms.  02/01/21: Now ~3.5 months s/p above procedures.  He states he has no significant pain at baseline.  He does  note some mild soreness if he has been especially active.  This quickly resolves after a day or so.  He has been continuing with physical therapy and is making good progress.  He has still been cautious with his shoulder and has not fully progressed to all activities that he wishes to perform.  12/01/20: He is now 6 weeks after undergoing above procedures.  He has been performing daily stretches at home and attending physical therapy twice weekly.  He states that he continues to make good improvements and his range of motion is almost normalized.  He states the numbness about his fingertips and radial aspect of his wrist have also significantly improved and he hardly feels these sensations.  He states he gets an occasional ache anteriorly in the shoulder with some radiation down his anterior arm, but this is very minor.  10/28/2020: He is now 2 weeks after undergoing right shoulder capsular release and manipulation under anesthesia with biceps tenotomy.   He has been attending physical therapy regularly and performing home stretches daily since surgery.  He states he can already noticed a significant difference compared to his preoperative status.  He has improved range of motion both actively and passively and he is able to perform daily activities with less difficulty.  He also notes that the numbness about his fingertips and radial aspect of his wrist have also significantly improved since the surgery.  He has also not noticed a Popeye deformity on the right upper extremity.  09/30/20: He returns now approximately 6 months after undergoing right posterior labral repair with arthroscopic superior border subscapularis repair.  He states he has made some progress with forward flexion, but still has difficulty with abduction, external rotation, and internal rotation of the shoulder.  He states that he is significantly improved from his preoperative status that he does not have any significant pain.  However, he  does note difficulty with overhead activities and motions where he attempts to throw.  08/19/2020: At the patient's last visit with me, a combined glenohumeral joint subacromial space corticosteroid injection was given.  Since that time, the patient has been attending physical therapy and performing home stretching exercises multiple times daily.  He feels that his range of motion is improving slowly.  He does not have any significant pain in the shoulder, and he notes that his preoperative neck pain has improved as well.  07/22/2020: He is now 3.5 months after undergoing above surgeries.  He notes that his soreness is improving slightly.  He still has sensations of tightness.  He feels that his forward flexion has improved somewhat.  He has been performing home exercises daily.  07/01/2020:  He is now approximately 12 weeks after undergoing above procedures.  He is making progress with physical therapy in regards to range of motion, but still feels significant tightness.  He also has some mild associated soreness.  He feels that he is being very cautious with his shoulder.  Numbness tingling sensations of her fingertips are improving.  Numbness about the wrist is still present.  04/22/2020: He is now 2 weeks after undergoing above procedures.  He reports minimal pain at baseline.  He still has some mild sensations of numbness about his thumb and volar aspect of his wrist.  He is attending physical therapy as instructed and performing home exercises as well.   03/25/20: Cristian Fernandez is a 43 y.o. male  referred by Tobie for R shoulder evaluation and management.  Prior medical records were reviewed.  He was initially evaluated by Thom Geralds on 09/17/2019.  At that time, he was having increased numbness along the ulnar distribution of the right upper extremity.  He was started on a Medrol  Dosepak at that time.  He was prescribed NSAIDs later and also prescribed activity modifications with home exercises.  He  then underwent an MRI of the cervical spine and an EMG.  EMG was normal.  He was referred to physiatry and underwent a right C6-7 epidural steroid injection on 01/16/2020 and again on 02/06/2020.  He reported 40% pain relief with these injections, but still had persistent right shoulder pain.He was then evaluated by Medford Amber on 02/26/2020.  He obtained an MR arthrogram and was then referred to me for further evaluation.  He currently rates pain severity as a 5/10. His symptoms began initially over 13 years ago when he had an injury to his shoulder in 2008, but did not require any surgical intervention as it was treated appropriately with physical therapy with resolution of symptoms.  He has had intermittent numbness episodes to the upper extremity as well as increased popping and clicking with overhead motion.  Symptoms significantly worsened 7 months ago and he has had the treatment as described above.  Currently, he notes significant catching and clicking sensations within the shoulder with range of motion as well as pain posteriorly that can radiate to the anterior aspect of the shoulder.  He has significant limitations in his desired activity due to his symptoms as he cannot throw a ball and playing with his kids.  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  He is right-handed. He enjoys playing baseball for recreation as well as taekwondo and working out.  He has never smoked.   PMHx, PSurgHx, Fam Hx, Soc Hx, Meds, Allergies: History reviewed. No pertinent past medical history.  Past Surgical History:  Procedure Laterality Date  . Right shoulder arthroscopic posterior labral reapir & capsulorraphy, rotator cuff repair (subscapularis) extensive glenohumeral debridement Right 04/09/2020   Dr. Tobie  . Right shoulder capsular releases, lysis of adhesions, manipulation under anesthesia,  subacromial decompression without acromioplasty, biceps tenotomy, glenohumeral and subacromial injections with corticosteroid Right 10/16/2020   Dr. Tobie  . Right knee arthroscopy, medial meniscus repair (horizontal tear, posterior horn) Right 04/14/2023   Dr. Tobie  . TOOTH EXTRACTION     Wisdom tooth    Family History  Problem Relation Age of Onset  . No Known Problems Mother   . No Known Problems Father     Social History   Socioeconomic History  . Marital status: Married  Tobacco Use  . Smoking status: Former  . Smokeless tobacco: Never  Vaping Use  . Vaping status: Never Used  Substance and Sexual Activity  . Alcohol use: Yes  . Drug use: Never  . Sexual activity: Not Currently   Social Drivers of Health   Housing Stability: Unknown (07/28/2023)   Housing Stability Vital Sign   . Homeless in the Last Year: No     No  current outpatient medications on file.   No current facility-administered medications for this visit.    No Known Allergies  Review of Systems: A 10+ ROS was performed, reviewed, and the pertinent orthopaedic findings are documented in the HPI.  I have reviewed and agree with the ROS captured by the CMA.    Physical Exam: Vitals:   08/16/23 0920  BP: 118/70   General/Constitutional: NAD, conversant Eyes: Pupils equal and round, extraocular movements intact ENT: atraumatic external nose and ears, moist mucous membranes Respiratory: non-labored breathing, symmetric chest rise, chest sounds clear. Cardiovascular: no visible lower extremity edema, peripheral pulses present, regular rate and rhythm  Skin: normal skin turgor, warm and dry Neurological: cranial nerves grossly intact, sensation grossly intact Psychological:  Appropriate mood and affect; appropriate judgment Musculoskeletal: as detailed below:  Comprehensive Knee Exam:   Right Left  Range of Motion 2/0/135 2/0/140  Tenderness Mild over medial joint line; moderate pain over pes  bursa   Skin Incisions well-healed Normal  Gait    Alignment Normal Normal  Quad Atrophy Moderate none  Crepitus    Effusion Trace None  Straight Leg Raise Able to perform without lag     Ligamentous Exam   Right Left  Lachman    Pivot Shift    Varus 0    Varus 30    Valgus 0    Valgus 30    Anterior Drawer    Posterior Drawer    Posterior Sag    Quad Active Test    Dial @ 30    Dial @ 90    Posterolateral Drawer    ER Recurvatum      Meniscal Exam   Right Left  Hyperflexion Test Negative Negative  Hyperextension Test Negative Negative  McMurray's negative   Pain with Squat    Apley Grind       Patellofemoral Exam   Right Left  Moving Patellar Apprehension    J-Sign    Excessive Tilt Negative Negative  Lateral Patellar Mobility 2+ 2+  Medial Patellar Mobility 2+ 2+  Patellar Grind      Neurovascular   Right Left  Quadriceps Strength    Hip Abductor Strength 4+/5 5/5  Distal Motor Normal Normal  Distal Sensory Normal Normal  Distal Pulses Normal Normal   Comprehensive Hip Exam:  Gait Patient does not use an assistive device.  Leg Lengths Equal   RIGHT  LEFT  Inspection No atrophy, skin lesions or scars No atrophy, skin lesions or scars   Palpation    Tenderness Greater trochanter, but this does not reproduce daily symptoms. No location  Lateral Compression of the pelvis does not reproduce pain complaints.  Range of Motion    Qualitative Normal flexion Normal flexion  Pain with ROM at mid-flexion Evidence of hip irritability consistent with synovitis  Is not present   Quantitative    Flexion 100 10  Extension 10 10  Abduction    Adduction    ER at 90 of flexion 30 40  IR at 90 of flexion 10 10  Strength    Flexion    Adduction    Abduction 4+/5 5/5   Pain Provocation Tests    Impingement (FADIR) Positive, reproduces daily symptoms   Sub-spine Impingement    Psoas Impingement/Anterior Capsule    Instability/log roll Negative    Posterior  Impingement    Lateral Rim Impingement    Trochanteric Pain Sign Negative    Straight leg raise (passive) No  Straight leg raise (active) No   Circumduction clunk No   Ab-HEER (Abduction, hyperextension, ER with lying on contralateral side)    Prone instability (ER hip w/posterior force on trochanter)    HEER (anterior apprehension -- supine, contralateral hip flexion, ipsiliteral hip hyperextension + ER)    Piriformis active test (lateral decubitus, heel on table --> active abd + ER against resistance)    Seated piriformis stretch (90 deg hip flexion, knee extension --> leg adducted and IR passively)    Peritrochanteric Space Exam    Pain over trochanter Moderate   Snapping with the bicycle maneuver Negative     Ober's Test Mildly positive     Weakness in abduction  Positive     Posterior Chain    Recruitment poor   Neurovascular    Distal Pulse normal normal  Distal Motor Normal Normal  Distal Sensation Normal Normal    Lumbar Spine   Patient has symmetric lumbar range of motion.    Imaging:  R Shoulder radiographs:  09/17/2019: On personal read, there are no fractures or dislocations.  There are no significant degenerative changes.   R knee radiographs: 02/16/23: 4 views of the right knee (weightbearing AP, Cecillia, [employer], lateral) were obtained. There are no fractures or dislocations.  There are no significant degenerative changes.  No significant lateral patellar tilt.  No large joint effusion.  L knee radiographs: 02/16/23: 4 views of the left knee (weightbearing AP, Cecillia, [employer], lateral) were obtained. There are no fractures or dislocations.  There are no significant degenerative changes.  No significant lateral patellar tilt.  No large joint effusion.  R hip radiographs: 08/16/23: 3 views of the right hip (AP, Dunn lateral, and false profile) were obtained. Are no fractures or dislocations.  There are mild degenerative changes with central osteophytosis and  posterior joint space narrowing.  Superior joint space is relatively well-preserved.  Lateral center edge angle measures 37 degrees.  Anterior center edge angle measures 34 degrees.  There does appear to be a mild cam deformity with decreased head-not offset and increase sclerosis about the femoral head-neck junction.  Alpha angle measures 54 degrees.  R Shoulder MRI: 03/10/20: FINDINGS:  Rotator cuff: Mild tendinosis of the anterior aspect of the distal  supraspinatus tendon without tear. Tendinosis with partial-thickness  insertional tear of the subscapularis tendon. The infraspinatus and  teres minor tendons are intact.   Muscles: Preserved bulk and signal intensity of the rotator cuff  musculature without edema, atrophy, or fatty infiltration.   Biceps long head: Intact and appropriately located without  tendinosis, tear, or evidence of tenosynovitis.   Acromioclavicular Joint: No significant arthropathy of the  acromioclavicular joint. No fluid or contrast present within the  subacromial-subdeltoid bursa.   Glenohumeral Joint: Glenohumeral joint is well distended with  injected contrast. No cartilage defect. No intra-articular loose  body or evidence of synovitis.   Labrum: Labrum is intact without tear or paralabral cyst.   Bones: No acute fracture. No dislocation. No suspicious bone lesion.   IMPRESSION:  1. Tendinosis with partial-thickness insertional tear of the  subscapularis tendon.  2. Mild tendinosis of the anterior aspect of the distal  supraspinatus tendon without tear.  3. Intact labrum.   On personal read, there is mild flattening of the posterior labrum and the humeral head is resting in a posteriorly subluxed position on the glenoid, suggestive of posterior instability.   R knee MRI: 02/22/23: On personal read, there is a horizontal/oblique tear of the posterior  horn of the medial meniscus.  There are no significant degenerative changes to the knee joint.   Remainder of ligaments and lateral meniscus appear intact.  I personally reviewed and visualized the aforementioned imaging studies. I additionally personally interpreted any radiographs taken during today's visit.   EMG/NCS: 12/18/19: Impression: This is a normal electrodiagnostic study of the right upper limb.  There is no electrodiagnostic evidence of cervical radiculopathy or ulnar neuropathy.   Clinical Correlation: History and exam were suggestive of C8 radiculopathy but NCS did not support that.    Assessment & Plan: Diagnoses and all orders for this visit:  Right hip pain -     X-ray hip right 2 or 3 views with or without pelvis; Future      Cristian Fernandez is a 43 y.o. male patient s/p R posterior labral repair and partial thickness subscapularis repair 04/09/20 and now s/p R shoulder lysis of adhesions, manipulation under anesthesia and biceps tenotomy on 10/16/20.  He is doing quite well from these procedures.  He is now s/p right medial meniscus repair of a horizontal tear of the posterior horn on 04/14/2023.  He is progressing appropriately postoperatively from this. He had developed bursitis that had an excellent short-term response with corticosteroid injection to this region, but has had some mild recurrence.  However, today, he is more symptomatic from right hip pain likely due to femoroacetabular impingement. 1. 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  I recommended an initial course of treatment consisting of corticosteroid junction to the right hip joint as well as formal physical therapy.  He was referred to Dr. Sharrie for this procedure.  Please see his separate procedure note today for further details.  Of note, on reexamination after the injection, the patient noted significant, essentially complete relief of his preinjection symptoms about the hip/groin.   2. I recommended physical therapy  to work on core and hip abductor strengthening.  Basic hip abduction strengthening exercises were demonstrated to the patient in the office today. 3.  If he has recurrence of pain, I would likely recommend obtaining an MR arthrogram and possible CT scan with 3D reconstructions.    ADDENDUM (09/11/23): He had ~2 days of excellent relief of all symptoms after hip joint corticosteroid injection before symptoms started to return. Given this, we can be relatively confident that the hip joint is the primary source of his symptoms. He has continued PT since that time, and he is still having pain, primarily about the groin region. I recommended obtaining an MR arthrogram of the R hip as well as a CT scan of the R hip for preoperative planning purposes. Patient to follow up with me after obtaining imaging studies. Orders placed.

## 2023-10-13 ENCOUNTER — Other Ambulatory Visit: Payer: Self-pay | Admitting: Orthopedic Surgery

## 2023-10-13 DIAGNOSIS — M25851 Other specified joint disorders, right hip: Secondary | ICD-10-CM

## 2023-10-18 ENCOUNTER — Ambulatory Visit
Admission: RE | Admit: 2023-10-18 | Discharge: 2023-10-18 | Disposition: A | Discharge status: Home / Self Care | Source: Ambulatory Visit | Attending: Orthopedic Surgery | Admitting: Orthopedic Surgery

## 2023-10-18 ENCOUNTER — Other Ambulatory Visit: Payer: Self-pay | Admitting: Orthopedic Surgery

## 2023-10-18 DIAGNOSIS — M25851 Other specified joint disorders, right hip: Secondary | ICD-10-CM

## 2023-10-24 ENCOUNTER — Ambulatory Visit
Admission: RE | Admit: 2023-10-24 | Discharge: 2023-10-24 | Disposition: A | Discharge status: Home / Self Care | Source: Ambulatory Visit | Attending: Orthopedic Surgery

## 2023-10-24 ENCOUNTER — Ambulatory Visit
Admission: RE | Admit: 2023-10-24 | Discharge: 2023-10-24 | Disposition: A | Discharge status: Home / Self Care | Source: Ambulatory Visit | Attending: Orthopedic Surgery | Admitting: Orthopedic Surgery

## 2023-10-24 DIAGNOSIS — M25851 Other specified joint disorders, right hip: Secondary | ICD-10-CM | POA: Insufficient documentation

## 2023-10-24 MED ORDER — GADOBUTROL 1 MMOL/ML IV SOLN
7.5000 mL | Freq: Once | INTRAVENOUS | Status: AC | PRN
  Administered 2023-10-24: 0.05 mL

## 2023-10-24 MED ORDER — IOHEXOL 180 MG/ML  SOLN
20.0000 mL | Freq: Once | INTRAMUSCULAR | Status: AC | PRN
  Administered 2023-10-24: 15 mL

## 2023-10-24 MED ORDER — LIDOCAINE HCL (PF) 1 % IJ SOLN
10.0000 mL | Freq: Once | INTRAMUSCULAR | Status: AC
  Administered 2023-10-24: 10 mL
  Filled 2023-10-24: qty 10

## 2023-10-24 MED ORDER — SODIUM CHLORIDE (PF) 0.9% IJ SOLUTION - NO CHARGE
20.0000 mL | INTRAMUSCULAR | Status: DC | PRN
  Administered 2023-10-24: 5 mL via INTRAVENOUS

## 2024-01-04 ENCOUNTER — Ambulatory Visit: Payer: Managed Care, Other (non HMO) | Admitting: Dermatology

## 2024-02-19 ENCOUNTER — Other Ambulatory Visit: Payer: Self-pay | Admitting: Orthopedic Surgery

## 2024-03-05 ENCOUNTER — Encounter
Admission: RE | Admit: 2024-03-05 | Discharge: 2024-03-05 | Disposition: A | Discharge status: Home / Self Care | Source: Ambulatory Visit | Attending: Orthopedic Surgery | Admitting: Orthopedic Surgery

## 2024-03-05 DIAGNOSIS — Z01810 Encounter for preprocedural cardiovascular examination: Secondary | ICD-10-CM | POA: Diagnosis not present

## 2024-03-05 DIAGNOSIS — Z01818 Encounter for other preprocedural examination: Secondary | ICD-10-CM | POA: Diagnosis present

## 2024-03-05 DIAGNOSIS — R001 Bradycardia, unspecified: Secondary | ICD-10-CM | POA: Insufficient documentation

## 2024-03-05 HISTORY — DX: Other specified joint disorders, right hip: M25.851

## 2024-03-05 LAB — CBC WITH DIFFERENTIAL/PLATELET
Abs Immature Granulocytes: 0.01 K/uL (ref 0.00–0.07)
Basophils Absolute: 0 K/uL (ref 0.0–0.1)
Basophils Relative: 1 %
Eosinophils Absolute: 0.2 K/uL (ref 0.0–0.5)
Eosinophils Relative: 2 %
HCT: 43.3 % (ref 39.0–52.0)
Hemoglobin: 14.6 g/dL (ref 13.0–17.0)
Immature Granulocytes: 0 %
Lymphocytes Relative: 15 %
Lymphs Abs: 0.9 K/uL (ref 0.7–4.0)
MCH: 29.9 pg (ref 26.0–34.0)
MCHC: 33.7 g/dL (ref 30.0–36.0)
MCV: 88.7 fL (ref 80.0–100.0)
Monocytes Absolute: 0.6 K/uL (ref 0.1–1.0)
Monocytes Relative: 10 %
Neutro Abs: 4.7 K/uL (ref 1.7–7.7)
Neutrophils Relative %: 72 %
Platelets: 188 K/uL (ref 150–400)
RBC: 4.88 MIL/uL (ref 4.22–5.81)
RDW: 11.9 % (ref 11.5–15.5)
WBC: 6.5 K/uL (ref 4.0–10.5)
nRBC: 0 % (ref 0.0–0.2)

## 2024-03-05 LAB — URINALYSIS, ROUTINE W REFLEX MICROSCOPIC
Bacteria, UA: NONE SEEN
Bilirubin Urine: NEGATIVE
Glucose, UA: NEGATIVE mg/dL
Hgb urine dipstick: NEGATIVE
Ketones, ur: NEGATIVE mg/dL
Leukocytes,Ua: NEGATIVE
Nitrite: NEGATIVE
Protein, ur: NEGATIVE mg/dL
Specific Gravity, Urine: 1.004 — ABNORMAL LOW (ref 1.005–1.030)
Squamous Epithelial / HPF: 0 /HPF (ref 0–5)
WBC, UA: 0 WBC/hpf (ref 0–5)
pH: 7 (ref 5.0–8.0)

## 2024-03-05 LAB — COMPREHENSIVE METABOLIC PANEL WITH GFR
ALT: 33 U/L (ref 0–44)
AST: 28 U/L (ref 15–41)
Albumin: 4.6 g/dL (ref 3.5–5.0)
Alkaline Phosphatase: 59 U/L (ref 38–126)
Anion gap: 8 (ref 5–15)
BUN: 13 mg/dL (ref 6–20)
CO2: 29 mmol/L (ref 22–32)
Calcium: 9.4 mg/dL (ref 8.9–10.3)
Chloride: 104 mmol/L (ref 98–111)
Creatinine, Ser: 1 mg/dL (ref 0.61–1.24)
GFR, Estimated: >60 mL/min (ref >60)
Glucose, Bld: 77 mg/dL (ref 70–99)
Potassium: 4.3 mmol/L (ref 3.5–5.1)
Sodium: 141 mmol/L (ref 135–145)
Total Bilirubin: 0.5 mg/dL (ref 0.0–1.2)
Total Protein: 6.7 g/dL (ref 6.5–8.1)

## 2024-03-05 NOTE — Patient Instructions (Addendum)
 Your procedure is scheduled on: 03/12/2024 Tuesday, Report to the Registration Desk on the 1st floor of the [facility]. To find out your arrival time, please call [phone] between 1PM - 3PM on: Monday,11/17 If your arrival time is 6:00 am, do not arrive before that time as the [facility] entrance doors do not open until 6:00 am.  REMEMBER: Instructions that are not followed completely may result in serious medical risk, up to and including death; or upon the discretion of your surgeon and anesthesiologist your surgery may need to be rescheduled.  Do not eat food after midnight the night before surgery.  No gum chewing or hard candies.  You may however, drink CLEAR liquids up to 2 hours before you are scheduled to arrive for your surgery. Do not drink anything within 2 hours of your scheduled arrival time.  Clear liquids include: - water  - apple juice without pulp - gatorade (not RED colors) - black coffee or tea (Do NOT add milk or creamers to the coffee or tea) Do NOT drink anything that is not on this list.   In addition, your doctor has ordered for you to drink the provided:  Ensure Pre-Surgery Clear Carbohydrate Drink   Drinking this carbohydrate drink up to two hours before surgery helps to reduce insulin resistance and improve patient outcomes. Please complete drinking 2 hours before scheduled arrival time.  One week prior to surgery: Stop Anti-inflammatories (NSAIDS) such as Advil , Aleve, Ibuprofen , Motrin , Naproxen, Naprosyn and Aspirin  based products such as Excedrin, Goody's Powder, BC Powder. Stop ANY OVER THE COUNTER supplements until after surgery.   You may however, continue to take Tylenol  if needed for pain up until the day of surgery.  Continue taking all of your other prescription medications up until the day of surgery.    No Alcohol for 24 hours before or after surgery.  No Smoking including e-cigarettes for 24 hours before surgery.  No chewable  tobacco products for at least 6 hours before surgery.  No nicotine patches on the day of surgery.  Do not use any recreational drugs for at least a week (preferably 2 weeks) before your surgery.  Please be advised that the combination of cocaine and anesthesia may have negative outcomes, up to and including death. If you test positive for cocaine, your surgery will be cancelled.  On the morning of surgery brush your teeth with toothpaste and water, you may rinse your mouth with mouthwash if you wish. Do not swallow any toothpaste or mouthwash.  Use CHG Soap or wipes as directed on instruction sheet.-provided for you   Do not wear jewelry, make-up, hairpins, clips or nail polish.  For welded (permanent) jewelry: bracelets, anklets, waist bands, etc.  Please have this removed prior to surgery.  If it is not removed, there is a chance that hospital personnel will need to cut it off on the day of surgery.  Do not wear lotions, powders, or perfumes.   Do not shave body hair from the neck down 48 hours before surgery.  Contact lenses, hearing aids and dentures may not be worn into surgery.  Do not bring valuables to the hospital. [facility] is not responsible for any missing/lost belongings or valuables.    . Notify your doctor if there is any change in your medical condition (cold, fever, infection).  Wear comfortable clothing (specific to your surgery type) to the hospital.  After surgery, you can help prevent lung complications by doing breathing exercises.  Take deep breaths and cough every 1-2 hours. Your doctor may order a device called an Incentive Spirometer to help you take deep breaths.  If you are being admitted to the hospital overnight, leave your suitcase in the car. After surgery it may be brought to your room.  In case of increased patient census, it may be necessary for you, the patient, to continue your postoperative care in the Same Day Surgery department.  If you  are being discharged the day of surgery, you will not be allowed to drive home. You will need a responsible individual to drive you home and stay with you for 24 hours after surgery.   If you are taking public transportation, you will need to have a responsible individual with you.  Please call the Pre-admissions Testing Dept. at [phone] if you have any questions about these instructions.  Surgery Visitation Policy:  Patients having surgery or a procedure may have two visitors.  Children under the age of 22 must have an adult with them who is not the patient.  Inpatient Visitation:    Visiting hours are 7 a.m. to 8 p.m. Up to four visitors are allowed at one time in a patient room. The visitors may rotate out with other people during the day.  One visitor age 60 or older may stay with the patient overnight and must be in the room by 8 p.m.   Merchandiser, Retail to address health-related social needs:  [email or web address]                                                                                                              Preparing for Surgery with CHLORHEXIDINE  GLUCONATE (CHG) Soap  Chlorhexidine  Gluconate (CHG) Soap  o An antiseptic cleaner that kills germs and bonds with the skin to continue killing germs even after washing  o Used for showering the night before surgery and morning of surgery  Before surgery, you can play an important role by reducing the number of germs on your skin.  CHG (Chlorhexidine  gluconate) soap is an antiseptic cleanser which kills germs and bonds with the skin to continue killing germs even after washing.  Please do not use if you have an allergy to CHG or antibacterial soaps. If your skin becomes reddened/irritated stop using the CHG.  1. Shower the NIGHT BEFORE SURGERY with CHG soap.  2. If you choose to wash your hair, wash your hair first as usual with your normal shampoo.  3. After shampooing, rinse your  hair and body thoroughly to remove the shampoo.  4. Use CHG as you would any other liquid soap. You can apply CHG directly to the skin and wash gently with a clean washcloth.  5. Apply the CHG soap to your body only from the neck down. Do not use on open wounds or open sores. Avoid contact with your eyes, ears, mouth, and genitals (private parts). Wash face and genitals (private parts) with your normal soap.  6. Wash thoroughly, paying special attention to the area where  your surgery will be performed.  7. Thoroughly rinse your body with warm water.  8. Do not shower/wash with your normal soap after using and rinsing off the CHG soap.  9. Do not use lotions, oils, etc., after showering with CHG.  10. Pat yourself dry with a clean towel.  11. Wear clean pajamas to bed the night before surgery.  12. Place clean sheets on your bed the night of your shower and do not sleep with pets.  13. Do not apply any deodorants/lotions/powders.  14. Please wear clean clothes to the hospital.  15. Remember to brush your teeth with your regular toothpaste.

## 2024-03-11 MED ORDER — LACTATED RINGERS IV SOLN: INTRAVENOUS | Status: DC

## 2024-03-11 MED ORDER — ORAL CARE MOUTH RINSE: 15.0000 mL | Freq: Once | OROMUCOSAL | Status: AC

## 2024-03-11 MED ORDER — CEFAZOLIN SODIUM-DEXTROSE 2-4 GM/100ML-% IV SOLN
2.0000 g | INTRAVENOUS | Status: AC
  Administered 2024-03-12 (×2): 2 g via INTRAVENOUS

## 2024-03-11 MED ORDER — CHLORHEXIDINE GLUCONATE 0.12 % MT SOLN
15.0000 mL | Freq: Once | OROMUCOSAL | Status: AC
  Administered 2024-03-12: 15 mL via OROMUCOSAL

## 2024-03-12 ENCOUNTER — Ambulatory Visit: Admitting: Certified Registered"

## 2024-03-12 ENCOUNTER — Encounter: Payer: Self-pay | Admitting: Orthopedic Surgery

## 2024-03-12 ENCOUNTER — Other Ambulatory Visit: Payer: Self-pay

## 2024-03-12 ENCOUNTER — Ambulatory Visit

## 2024-03-12 ENCOUNTER — Ambulatory Visit
Admission: RE | Admit: 2024-03-12 | Discharge: 2024-03-12 | Disposition: A | Discharge status: Home / Self Care | Attending: Orthopedic Surgery | Admitting: Orthopedic Surgery

## 2024-03-12 ENCOUNTER — Encounter
Admission: RE | Disposition: A | Discharge status: Home / Self Care | Payer: Self-pay | Source: Home / Self Care | Attending: Orthopedic Surgery

## 2024-03-12 DIAGNOSIS — M25851 Other specified joint disorders, right hip: Secondary | ICD-10-CM | POA: Diagnosis present

## 2024-03-12 DIAGNOSIS — S73191A Other sprain of right hip, initial encounter: Secondary | ICD-10-CM | POA: Insufficient documentation

## 2024-03-12 DIAGNOSIS — X58XXXA Exposure to other specified factors, initial encounter: Secondary | ICD-10-CM | POA: Diagnosis not present

## 2024-03-12 HISTORY — PX: HIP ARTHROSCOPY: SHX668

## 2024-03-12 SURGERY — ARTHROSCOPY HIP
Anesthesia: General | Site: Hip | Laterality: Right

## 2024-03-12 MED ORDER — OMEPRAZOLE MAGNESIUM 20 MG PO TBEC: 20.0000 mg | DELAYED_RELEASE_TABLET | Freq: Every day | ORAL | 0 refills | Status: AC

## 2024-03-12 MED ORDER — KETAMINE HCL 50 MG/5ML IJ SOSY
PREFILLED_SYRINGE | INTRAMUSCULAR | Status: DC | PRN
  Administered 2024-03-12: 30 mg via INTRAVENOUS

## 2024-03-12 MED ORDER — ASPIRIN 325 MG PO TBEC: 325.0000 mg | DELAYED_RELEASE_TABLET | Freq: Every day | ORAL | 0 refills | Status: AC

## 2024-03-12 MED ORDER — OXYCODONE HCL 5 MG PO TABS
10.0000 mg | ORAL_TABLET | ORAL | 0 refills | Status: AC | PRN
  Filled 2024-03-12: qty 30, 7d supply, fill #0

## 2024-03-12 MED ORDER — TIZANIDINE HCL 4 MG PO TABS: 4.0000 mg | ORAL_TABLET | Freq: Three times a day (TID) | ORAL | 1 refills | Status: AC

## 2024-03-12 MED ORDER — PHENYLEPHRINE 80 MCG/ML (10ML) SYRINGE FOR IV PUSH (FOR BLOOD PRESSURE SUPPORT)
PREFILLED_SYRINGE | INTRAVENOUS | Status: DC | PRN
Start: 2024-03-12 — End: 2024-03-12
  Administered 2024-03-12 (×2): 80 ug via INTRAVENOUS

## 2024-03-12 MED ORDER — DEXAMETHASONE SOD PHOSPHATE PF 10 MG/ML IJ SOLN
INTRAMUSCULAR | Status: DC | PRN
  Administered 2024-03-12: 10 mg via INTRAVENOUS

## 2024-03-12 MED ORDER — FENTANYL CITRATE (PF) 100 MCG/2ML IJ SOLN
INTRAMUSCULAR | Status: AC
  Filled 2024-03-12: qty 2

## 2024-03-12 MED ORDER — BUPIVACAINE LIPOSOME 1.3 % IJ SUSP
INTRAMUSCULAR | Status: AC
  Filled 2024-03-12: qty 20

## 2024-03-12 MED ORDER — DROPERIDOL 2.5 MG/ML IJ SOLN
INTRAMUSCULAR | Status: AC
  Filled 2024-03-12: qty 2

## 2024-03-12 MED ORDER — KETAMINE HCL 50 MG/5ML IJ SOSY
PREFILLED_SYRINGE | INTRAMUSCULAR | Status: AC
  Filled 2024-03-12: qty 5

## 2024-03-12 MED ORDER — ONDANSETRON HCL 4 MG/2ML IJ SOLN
INTRAMUSCULAR | Status: AC
  Filled 2024-03-12: qty 2

## 2024-03-12 MED ORDER — LIDOCAINE HCL (PF) 2 % IJ SOLN
INTRAMUSCULAR | Status: AC
  Filled 2024-03-12: qty 5

## 2024-03-12 MED ORDER — LACTATED RINGERS IR SOLN
Status: DC | PRN
  Administered 2024-03-12 (×2): 6000 mL
  Administered 2024-03-12: 3000 mL
  Administered 2024-03-12 (×4): 6000 mL

## 2024-03-12 MED ORDER — ACETAMINOPHEN 10 MG/ML IV SOLN
INTRAVENOUS | Status: DC | PRN
Start: 2024-03-12 — End: 2024-03-12
  Administered 2024-03-12: 1000 mg via INTRAVENOUS

## 2024-03-12 MED ORDER — LIDOCAINE HCL (CARDIAC) PF 100 MG/5ML IV SOSY
PREFILLED_SYRINGE | INTRAVENOUS | Status: DC | PRN
  Administered 2024-03-12: 100 mg via INTRAVENOUS

## 2024-03-12 MED ORDER — OXYCODONE HCL 5 MG/5ML PO SOLN: 5.0000 mg | Freq: Once | ORAL | Status: DC | PRN

## 2024-03-12 MED ORDER — EPHEDRINE 5 MG/ML INJ
INTRAVENOUS | Status: AC
  Filled 2024-03-12: qty 5

## 2024-03-12 MED ORDER — NAPROXEN 500 MG PO TABS
500.0000 mg | ORAL_TABLET | Freq: Two times a day (BID) | ORAL | 2 refills | Status: AC
  Filled 2024-03-12: qty 60, 30d supply, fill #0

## 2024-03-12 MED ORDER — CEFAZOLIN SODIUM-DEXTROSE 2-4 GM/100ML-% IV SOLN: 2.0000 g | Freq: Once | INTRAVENOUS | Status: DC

## 2024-03-12 MED ORDER — PROPOFOL 10 MG/ML IV BOLUS
INTRAVENOUS | Status: DC | PRN
  Administered 2024-03-12: 200 mg via INTRAVENOUS

## 2024-03-12 MED ORDER — OXYCODONE HCL 5 MG PO TABS: 5.0000 mg | ORAL_TABLET | ORAL | 0 refills | Status: AC | PRN

## 2024-03-12 MED ORDER — OMEPRAZOLE 20 MG PO CPDR
20.0000 mg | DELAYED_RELEASE_CAPSULE | Freq: Every day | ORAL | 0 refills | Status: AC
  Filled 2024-03-12: qty 28, 28d supply, fill #0

## 2024-03-12 MED ORDER — ASPIRIN 325 MG PO TBEC
325.0000 mg | DELAYED_RELEASE_TABLET | Freq: Every day | ORAL | 0 refills | Status: AC
  Filled 2024-03-12: qty 14, 14d supply, fill #0

## 2024-03-12 MED ORDER — FENTANYL CITRATE (PF) 100 MCG/2ML IJ SOLN
INTRAMUSCULAR | Status: DC | PRN
  Administered 2024-03-12 (×4): 50 ug via INTRAVENOUS

## 2024-03-12 MED ORDER — OXYCODONE HCL 5 MG PO TABS: 5.0000 mg | ORAL_TABLET | Freq: Once | ORAL | Status: DC | PRN

## 2024-03-12 MED ORDER — ONDANSETRON 4 MG PO TBDP: 4.0000 mg | ORAL_TABLET | Freq: Three times a day (TID) | ORAL | 0 refills | Status: AC | PRN

## 2024-03-12 MED ORDER — ONDANSETRON 4 MG PO TBDP
4.0000 mg | ORAL_TABLET | Freq: Three times a day (TID) | ORAL | 0 refills | Status: AC | PRN
  Filled 2024-03-12: qty 20, 7d supply, fill #0

## 2024-03-12 MED ORDER — SUGAMMADEX SODIUM 200 MG/2ML IV SOLN
INTRAVENOUS | Status: DC | PRN
  Administered 2024-03-12: 200 mg via INTRAVENOUS

## 2024-03-12 MED ORDER — DROPERIDOL 2.5 MG/ML IJ SOLN
0.6250 mg | Freq: Once | INTRAMUSCULAR | Status: AC
  Administered 2024-03-12: 0.625 mg via INTRAVENOUS

## 2024-03-12 MED ORDER — ONDANSETRON HCL 4 MG/2ML IJ SOLN
INTRAMUSCULAR | Status: DC | PRN
Start: 2024-03-12 — End: 2024-03-12
  Administered 2024-03-12: 4 mg via INTRAVENOUS

## 2024-03-12 MED ORDER — ACETAMINOPHEN 500 MG PO TABS: 1000.0000 mg | ORAL_TABLET | Freq: Three times a day (TID) | ORAL | 2 refills | Status: AC

## 2024-03-12 MED ORDER — BUPIVACAINE HCL (PF) 0.5 % IJ SOLN
INTRAMUSCULAR | Status: AC
  Filled 2024-03-12: qty 30

## 2024-03-12 MED ORDER — EPINEPHRINE PF 1 MG/ML IJ SOLN
INTRAMUSCULAR | Status: AC
  Filled 2024-03-12: qty 4

## 2024-03-12 MED ORDER — EPHEDRINE SULFATE-NACL 50-0.9 MG/10ML-% IV SOSY
PREFILLED_SYRINGE | INTRAVENOUS | Status: DC | PRN
  Administered 2024-03-12: 5 mg via INTRAVENOUS

## 2024-03-12 MED ORDER — ACETAMINOPHEN 10 MG/ML IV SOLN
INTRAVENOUS | Status: AC
  Filled 2024-03-12: qty 100

## 2024-03-12 MED ORDER — TIZANIDINE HCL 4 MG PO TABS
4.0000 mg | ORAL_TABLET | Freq: Three times a day (TID) | ORAL | 1 refills | Status: AC
  Filled 2024-03-12: qty 90, 30d supply, fill #0

## 2024-03-12 MED ORDER — MIDAZOLAM HCL 2 MG/2ML IJ SOLN
INTRAMUSCULAR | Status: AC
  Filled 2024-03-12: qty 2

## 2024-03-12 MED ORDER — DEXMEDETOMIDINE HCL IN NACL 80 MCG/20ML IV SOLN
INTRAVENOUS | Status: DC | PRN
  Administered 2024-03-12: 8 ug via INTRAVENOUS
  Administered 2024-03-12: 4 ug via INTRAVENOUS
  Administered 2024-03-12: 8 ug via INTRAVENOUS

## 2024-03-12 MED ORDER — MIDAZOLAM HCL (PF) 2 MG/2ML IJ SOLN
INTRAMUSCULAR | Status: DC | PRN
  Administered 2024-03-12: 2 mg via INTRAVENOUS

## 2024-03-12 MED ORDER — LACTATED RINGERS IV SOLN
INTRAVENOUS | Status: DC | PRN
  Administered 2024-03-12: 12004 mL

## 2024-03-12 MED ORDER — PHENYLEPHRINE 80 MCG/ML (10ML) SYRINGE FOR IV PUSH (FOR BLOOD PRESSURE SUPPORT)
PREFILLED_SYRINGE | INTRAVENOUS | Status: AC
  Filled 2024-03-12: qty 10

## 2024-03-12 MED ORDER — ROCURONIUM BROMIDE 100 MG/10ML IV SOLN
INTRAVENOUS | Status: DC | PRN
  Administered 2024-03-12: 20 mg via INTRAVENOUS
  Administered 2024-03-12: 30 mg via INTRAVENOUS
  Administered 2024-03-12: 60 mg via INTRAVENOUS
  Administered 2024-03-12: 20 mg via INTRAVENOUS
  Administered 2024-03-12: 40 mg via INTRAVENOUS
  Administered 2024-03-12: 20 mg via INTRAVENOUS

## 2024-03-12 MED ORDER — ACETAMINOPHEN EXTRA STRENGTH 500 MG PO TABS
500.0000 mg | ORAL_TABLET | Freq: Three times a day (TID) | ORAL | 2 refills | Status: AC
  Filled 2024-03-12: qty 90, 30d supply, fill #0

## 2024-03-12 MED ORDER — BUPIVACAINE HCL 0.5 % IJ SOLN
INTRAMUSCULAR | Status: DC | PRN
  Administered 2024-03-12: 30 mL via SURGICAL_CAVITY

## 2024-03-12 MED ORDER — NAPROXEN 500 MG PO TABS: 500.0000 mg | ORAL_TABLET | Freq: Two times a day (BID) | ORAL | 2 refills | Status: AC

## 2024-03-12 MED ORDER — PROPOFOL 10 MG/ML IV BOLUS
INTRAVENOUS | Status: AC
  Filled 2024-03-12: qty 20

## 2024-03-12 MED ORDER — CHLORHEXIDINE GLUCONATE 0.12 % MT SOLN
OROMUCOSAL | Status: AC
  Filled 2024-03-12: qty 15

## 2024-03-12 MED ORDER — ROCURONIUM BROMIDE 10 MG/ML (PF) SYRINGE
PREFILLED_SYRINGE | INTRAVENOUS | Status: AC
  Filled 2024-03-12: qty 10

## 2024-03-12 MED ORDER — FENTANYL CITRATE (PF) 100 MCG/2ML IJ SOLN: 25.0000 ug | INTRAMUSCULAR | Status: DC | PRN

## 2024-03-12 MED ORDER — CEFAZOLIN SODIUM-DEXTROSE 2-4 GM/100ML-% IV SOLN
INTRAVENOUS | Status: AC
  Filled 2024-03-12: qty 100

## 2024-03-12 SURGICAL SUPPLY — 57 items
ADAPTER IRRIG TUBE 2 SPIKE SOL (ADAPTER) ×2 IMPLANT
ANCHOR SUT 1.4 FLEX (Anchor) IMPLANT
ANCHOR SUT 2.4 SS KNTLS #1 (Anchor) IMPLANT
BIT DRILL FLEX NANOTACK (BIT) IMPLANT
BIT DRILL SS CINCHLOCK (BIT) ×1 IMPLANT
BLADE SAMURAI STR FULL RADIUS (BLADE) ×1 IMPLANT
BLADE SURG SZ11 CARB STEEL (BLADE) ×1 IMPLANT
BNDG ADH 1X3 SHEER STRL LF (GAUZE/BANDAGES/DRESSINGS) ×6 IMPLANT
BUR 4.0 ROUND XL DIAMOND (BUR) ×1 IMPLANT
BUR 5.5 ROUND LONG FS 8 FLUTE (BUR) ×1 IMPLANT
CANNULA 8 789 TRANSPORT (CANNULA) ×1 IMPLANT
CANNULA OBTURATOR FLOWPORT (CANNULA) ×1 IMPLANT
CHLORAPREP W/TINT 26 (MISCELLANEOUS) ×1 IMPLANT
COOLER ICEMAN CLASSIC (MISCELLANEOUS) ×1 IMPLANT
COVER LIGHT HANDLE STERIS (MISCELLANEOUS) ×2 IMPLANT
CUTTER AGGRESSIVE PLUS 4D 180L (CUTTER) ×1 IMPLANT
DEVICE SUCT BLK HOLE OR FLOOR (MISCELLANEOUS) ×1 IMPLANT
DRAPE ARTHROSCOPY W/POUCH 90 (DRAPES) ×1 IMPLANT
DRAPE C-ARM 42X72 X-RAY (DRAPES) ×1 IMPLANT
DRAPE SHEET LG 3/4 BI-LAMINATE (DRAPES) IMPLANT
DRAPE SURG 17X11 SM STRL (DRAPES) ×1 IMPLANT
GAUZE SPONGE 4X4 12PLY STRL (GAUZE/BANDAGES/DRESSINGS) ×1 IMPLANT
GLOVE BIOGEL PI IND STRL 8 (GLOVE) ×2 IMPLANT
GLOVE ORTHO TXT STRL SZ7.5 (GLOVE) ×2 IMPLANT
GLOVE SURG ORTHO 8.0 STRL STRW (GLOVE) ×2 IMPLANT
GOWN SRG LRG LVL 4 IMPRV REINF (GOWNS) ×1 IMPLANT
GOWN SRG XL LONG LVL 3 NONREIN (GOWNS) ×1 IMPLANT
IV LR IRRIG 3000ML ARTHROMATIC (IV SOLUTION) ×6 IMPLANT
KIT PATIENT POSITION MEDIUM (KITS) IMPLANT
KIT PORTAL ENTRY HIP ACCESS (KITS) ×1 IMPLANT
KIT TURNOVER KIT A (KITS) ×1 IMPLANT
MANIFOLD NEPTUNE II (INSTRUMENTS) ×2 IMPLANT
MAT ABSORB FLUID 56X50 GRAY (MISCELLANEOUS) ×1 IMPLANT
NDL INJECTOR II CARTRIDGE (MISCELLANEOUS) ×1 IMPLANT
NDL SAFETY ECLIP 18X1.5 (MISCELLANEOUS) ×1 IMPLANT
NEEDLE INJECTOR II CARTRIDGE (MISCELLANEOUS) ×1 IMPLANT
PACK ARTHROSCOPY KNEE (MISCELLANEOUS) ×2 IMPLANT
PAD ABD DERMACEA PRESS 5X9 (GAUZE/BANDAGES/DRESSINGS) ×1 IMPLANT
PAD ARMBOARD POSITIONER FOAM (MISCELLANEOUS) ×1 IMPLANT
PAD COLD UNI XL WRAP-ON (PAD) ×1 IMPLANT
PASSER SUT 1.5D CRESCENT (INSTRUMENTS) ×1 IMPLANT
PASSER SUT 70D UP ANGLED (INSTRUMENTS) ×1 IMPLANT
SPONGE T-LAP 18X18 ~~LOC~~+RFID (SPONGE) ×1 IMPLANT
SUT VIC AB 2-0 CT2 27 (SUTURE) ×1 IMPLANT
SUT XBRAID 1.4 BLK/WHT (SUTURE) IMPLANT
SUT XBRAID 1.4 BLUE (SUTURE) IMPLANT
SUT XBRAID 1.4 WHITE/BLUE (SUTURE) IMPLANT
SUT ZIPLINE SZ2 BLK (SUTURE) ×3 IMPLANT
SUT ZIPLINE SZ2 GREEN (SUTURE) ×4 IMPLANT
SUTURE EHLN 3-0 FS-10 30 BLK (SUTURE) ×1 IMPLANT
SUTURE TAPE XBRAID 1.2 BLUE 45 (SUTURE) IMPLANT
TRAP FLUID SMOKE EVACUATOR (MISCELLANEOUS) ×1 IMPLANT
TRAY FOLEY SLVR 16FR LF STAT (SET/KITS/TRAYS/PACK) ×1 IMPLANT
TUBE SET DOUBLEFLO INFLOW (TUBING) ×1 IMPLANT
TUBE SET DOUBLEFLO OUTFLOW (TUBING) ×1 IMPLANT
WAND SERFAS 50-S SWEEP XL (INSTRUMENTS) ×1 IMPLANT
WATER STERILE IRR 500ML POUR (IV SOLUTION) ×1 IMPLANT

## 2024-03-12 NOTE — Anesthesia Postprocedure Evaluation (Signed)
 Anesthesia Post Note  Patient: Cristian Fernandez  Procedure(s) Performed: ARTHROSCOPY HIP (Right: Hip)  Patient location during evaluation: PACU Anesthesia Type: General Level of consciousness: awake and alert Pain management: pain level controlled Vital Signs Assessment: post-procedure vital signs reviewed and stable Respiratory status: spontaneous breathing, nonlabored ventilation, respiratory function stable and patient connected to nasal cannula oxygen Cardiovascular status: blood pressure returned to baseline and stable Postop Assessment: no apparent nausea or vomiting Anesthetic complications: no   No notable events documented.   Last Vitals:  Vitals:   03/12/24 1238 03/12/24 1245  BP:  111/80  Pulse: 91 83  Resp: 12 13  Temp:    SpO2: 97% 97%    Last Pain:  Vitals:   03/12/24 1245  TempSrc:   PainSc: Asleep                 Debby Mines

## 2024-03-12 NOTE — Evaluation (Signed)
 Physical Therapy Evaluation Patient Details Name: Cristian Fernandez MRN: [id number] DOB: 1981/03/09 Today's Date: 03/12/2024  History of Present Illness  The patient is a 43 y.o. year old male who presents with persistent hip pain.  Radiographs demonstrated FAI morphology and the MRI revealed a labral tear.  He has failed greater than 3 months of non-operative treatment to date including activity modifications, physical therapy, and corticosteroid injection. He is s/p R hip arthroscopy, labral repair, acetabuloscopy, femoral osteochondroplasty and capsule closure by Dr. Tobie on 11/18.  Clinical Impression  Pt is a very pleasant 43 y.o. male admitted for elective R hip arthroscopy with acetabuloplasty, labral repair, femoral osteochondroplasty and capsular close performed on 11/18 by Dr. Tobie.  Pt was received in supine and was alert and agreeable to therapy. SPT educated pt on TTWB/FFWB precautions with great recall throughout session. Pt also educated on polar care and abd brace management. Pt requires min A to guide R leg on/off bed for bed mobility but is able to maintain EOB balance without UE support. Pt instructed on proper hand placement for RW use and was able to perform STS to RW with min A for stability purposes.  Pt able to ambulate for 30 ft with RW and CGA while maintaining FFWB precautions but ambulation limited d/t concern for nausea and dizziness. Pt experienced dizziness and nausea frequently during session and had MAP over 65 in seated after standing at RW for some time. Pt reports he owns a set of crutches but when given the choice he opted for RW. Pt left in bed with wife at bedside and all needs met. Pt will benefit from planned OP services to address his post-operative reduction in strength, balance, and functional mobility.      If plan is discharge home, recommend the following: A little help with walking and/or transfers   Can travel by private vehicle        Equipment  Recommendations Rolling walker (2 wheels);BSC/3in1  Recommendations for Other Services       Functional Status Assessment Patient has had a recent decline in their functional status and demonstrates the ability to make significant improvements in function in a reasonable and predictable amount of time.     Precautions / Restrictions Precautions Precautions: Other (comment) (R hip arthroscopy) Recall of Precautions/Restrictions: Intact Required Braces or Orthoses: Other Brace Other Brace: hip abd brace Restrictions Weight Bearing Restrictions Per Provider Order: Yes RLE Weight Bearing Per Provider Order: Touchdown weight bearing      Mobility  Bed Mobility Overal bed mobility: Needs Assistance Bed Mobility: Supine to Sit, Sit to Supine     Supine to sit: Min assist Sit to supine: Min assist   General bed mobility comments: min A to guide surgical leg on/off bed    Transfers Overall transfer level: Needs assistance Equipment used: Rolling walker (2 wheels) Transfers: Sit to/from Stand Sit to Stand: Min assist           General transfer comment: min A 1+ for STS for steadiness    Ambulation/Gait Ambulation/Gait assistance: Contact guard assist Gait Distance (Feet): 30 Feet Assistive device: Rolling walker (2 wheels) Gait Pattern/deviations: Decreased stance time - right, Decreased dorsiflexion - right, Decreased weight shift to right       General Gait Details: ambulation for 28ft with CGA and great maintenance of FFWB/TTWB precuations. ambulation limited d/t post-operative dizziness and nausea.  Stairs            Psychologist, [employer] [facility]  Tilt Bed    Modified Rankin (Stroke Patients Only)       Balance Overall balance assessment: Needs assistance Sitting-balance support: Feet supported, No upper extremity supported Sitting balance-Leahy Scale: Good Sitting balance - Comments: able to maintain balance EOB without UE support   Standing balance  support: Bilateral upper extremity supported, Reliant on assistive device for balance, During functional activity Standing balance-Leahy Scale: Fair Standing balance comment: able to maintain standing balance with BUE support on RW for steadiness and WB precuations                             Pertinent Vitals/Pain Pain Assessment Pain Assessment: 0-10 Pain Score: 1  Pain Descriptors / Indicators: Grimacing Pain Intervention(s): Monitored during session    Home Living Family/patient expects to be discharged to:: Private residence Living Arrangements: Spouse/significant other Available Help at Discharge: Family Type of Home: House Home Access: Level entry       Home Layout: One level Home Equipment: Crutches Additional Comments: pt lives in a multi-level home but plans to live in fully functional basement with level entry during his recovery    Prior Function Prior Level of Function : Independent/Modified Independent             Mobility Comments: IND with all mobility ADLs Comments: IND with all ADLs     Extremity/Trunk Assessment   Upper Extremity Assessment Upper Extremity Assessment: Overall WFL for tasks assessed    Lower Extremity Assessment Lower Extremity Assessment: RLE deficits/detail RLE Deficits / Details: FFWB with abd brace RLE: Unable to fully assess due to immobilization RLE Sensation: WNL    Cervical / Trunk Assessment Cervical / Trunk Assessment: Normal  Communication   Communication Communication: No apparent difficulties    Cognition Arousal: Alert Behavior During Therapy: WFL for tasks assessed/performed   PT - Cognitive impairments: No apparent impairments                         Following commands: Intact       Cueing Cueing Techniques: Verbal cues     General Comments      Exercises Other Exercises Other Exercises: edu on safe hand placement with RW Other Exercises: edu on polar care frequency Other  Exercises: edu on WB precautions with great recall with functional activity   Assessment/Plan    PT Assessment Patient needs continued PT services  PT Problem List Decreased strength;Decreased activity tolerance;Decreased balance;Decreased mobility;Decreased range of motion       PT Treatment Interventions Gait training;Stair training;Functional mobility training;Therapeutic activities;Balance training    PT Goals (Current goals can be found in the Care Plan section)  Acute Rehab PT Goals Patient Stated Goal: to return home PT Goal Formulation: With patient Time For Goal Achievement: 03/26/24 Potential to Achieve Goals: Good    Frequency BID     Co-evaluation               AM-PAC PT 6 Clicks Mobility  Outcome Measure Help needed turning from your back to your side while in a flat bed without using bedrails?: A Little Help needed moving from lying on your back to sitting on the side of a flat bed without using bedrails?: A Little Help needed moving to and from a bed to a chair (including a wheelchair)?: A Little Help needed standing up from a chair using your arms (e.g., wheelchair or bedside chair)?: A Little Help  needed to walk in hospital room?: A Little Help needed climbing 3-5 steps with a railing? : A Lot 6 Click Score: 17    End of Session Equipment Utilized During Treatment: Gait belt;Other (comment) (abd brace) Activity Tolerance: Other (comment) (pt reports frequent dizziness/nausea with position change) Patient left: in bed;with family/visitor present Nurse Communication: Weight bearing status;Mobility status PT Visit Diagnosis: Other abnormalities of gait and mobility (R26.89)    Time: 8573-8556 PT Time Calculation (min) (ACUTE ONLY): 17 min   Charges:   PT Evaluation $PT Eval Low Complexity: 1 Low PT Treatments $Gait Training: 8-22 mins PT General Charges $$ ACUTE PT VISIT: 1 Visit         Allena Bulls, SPT   Allena Bulls 03/12/2024, 3:35 PM

## 2024-03-12 NOTE — Anesthesia Preprocedure Evaluation (Signed)
 Anesthesia Evaluation  Patient identified by MRN, date of birth, ID band Patient awake  General Assessment Comment:Patient says it takes a lot to put me down. And that he is a fast metabolizer of novacaine  Reviewed: Allergy & Precautions, NPO status , Patient's Chart, lab work & pertinent test results  History of Anesthesia Complications Negative for: history of anesthetic complications  Airway Mallampati: II  TM Distance: >3 FB Neck ROM: Full    Dental no notable dental hx. (+) Teeth Intact   Pulmonary neg sleep apnea, neg COPD, Patient abstained from smoking.Not current smoker, former smoker Has had some difficulty since his covid pneumonia in 2021. Not on inhalers or oxygen, breathing feels good today  Occasional cigar smoker   Pulmonary exam normal breath sounds clear to auscultation       Cardiovascular Exercise Tolerance: Good (-) hypertension(-) CAD and (-) Past MI negative cardio ROS (-) dysrhythmias  Rhythm:Regular Rate:Normal - Systolic murmurs    Neuro/Psych negative neurological ROS  negative psych ROS   GI/Hepatic ,neg GERD  ,,(+)     (-) substance abuse    Endo/Other  neg diabetes    Renal/GU      Musculoskeletal   Abdominal   Peds  Hematology   Anesthesia Other Findings Past Medical History: 1999: Adverse effect of anesthesia     Comment:  was hard to wake up after wisdom teeth No date: Allergic rhinitis No date: Knee injury     Comment:  R knee injury at 43 years old, recurrent effusions 04/2019: Pneumonia     Comment:  double pneumonia   Reproductive/Obstetrics                              Anesthesia Physical Anesthesia Plan  ASA: 1  Anesthesia Plan: General ETT   Post-op Pain Management:    Induction: Intravenous  PONV Risk Score and Plan: 2 and Ondansetron , Dexamethasone  and Midazolam   Airway Management Planned: Oral ETT  Additional Equipment:    Intra-op Plan:   Post-operative Plan: Extubation in OR  Informed Consent: I have reviewed the patients History and Physical, chart, labs and discussed the procedure including the risks, benefits and alternatives for the proposed anesthesia with the patient or authorized representative who has indicated his/her understanding and acceptance.     Dental Advisory Given  Plan Discussed with: Anesthesiologist, CRNA and Surgeon  Anesthesia Plan Comments: (Patient consented for risks of anesthesia including but not limited to:  - adverse reactions to medications - damage to eyes, teeth, lips or other oral mucosa - nerve damage due to positioning  - sore throat or hoarseness - Damage to heart, brain, nerves, lungs, other parts of body or loss of life  Patient voiced understanding and assent.)        Anesthesia Quick Evaluation

## 2024-03-12 NOTE — Anesthesia Procedure Notes (Signed)
 Procedure Name: Intubation Date/Time: 03/12/2024 7:37 AM  Performed by: Lennie Lamarr HERO, CRNAPre-anesthesia Checklist: Patient identified, Emergency Drugs available, Suction available and Patient being monitored Patient Re-evaluated:Patient Re-evaluated prior to induction Oxygen Delivery Method: Circle System Utilized Preoxygenation: Pre-oxygenation with 100% oxygen Induction Type: IV induction Ventilation: Mask ventilation without difficulty Laryngoscope Size: McGrath and 4 Grade View: Grade I Tube type: Oral Tube size: 7.5 mm Number of attempts: 1 Airway Equipment and Method: Stylet and Oral airway Placement Confirmation: ETT inserted through vocal cords under direct vision, positive ETCO2 and breath sounds checked- equal and bilateral Secured at: 22 cm Tube secured with: Tape Dental Injury: Teeth and Oropharynx as per pre-operative assessment

## 2024-03-12 NOTE — Transfer of Care (Signed)
 Immediate Anesthesia Transfer of Care Note  Patient: Cristian Fernandez  Procedure(s) Performed: ARTHROSCOPY HIP (Right: Hip)  Patient Location: PACU  Anesthesia Type:General  Level of Consciousness: drowsy and patient cooperative  Airway & Oxygen Therapy: Patient Spontanous Breathing and Patient connected to face mask oxygen  Post-op Assessment: Report given to RN, Post -op Vital signs reviewed and stable, and Patient moving all extremities X 4  Post vital signs: Reviewed and stable  Last Vitals:  Vitals Value Taken Time  BP 114/78 03/12/24 12:05  Temp 35.9 C 03/12/24 12:05  Pulse 83 03/12/24 12:07  Resp 10 03/12/24 12:07  SpO2 100 % 03/12/24 12:07  Vitals shown include unfiled device data.  Last Pain:  Vitals:   03/12/24 0617  TempSrc: Temporal  PainSc: 3          Complications: No notable events documented.

## 2024-03-12 NOTE — Discharge Instructions (Addendum)
 Hip Arthroscopy Post-Operative Instructions  1. Physical Therapy should start within 3-4 days of surgery. If your therapist has ANY questions, please ask them to call our offices. 2. If oozing from surgery site occurs, and the dressing appears soaked with bloody fluid please change the dressing as needed. This normally occurs after fluid irrigation during surgery, and will resolve within 24-36 hours. 3. Icing is very important for the first 5-7 days postoperative, and ice is applied (ice packs or ice therapy) as often as possible or at least for 20-minute periods 3-4 times per day. Ice should not be applied directly on the skin. 4. Physical therapist will remove dressing at 1st PT visit. 5. Apply Band-Aids to wound sites and change them once a day. Keep the wound clean and dry. 6. Please do not use bacitracin or other ointments under the bandage. 7. Showering is allowed on post-op day #4 if the wound is dry. MAKE SURE EACH INCISION IS COVERED WITH A WATERPROOF BANDAID DURING SHOWER ONLY! 8. Do not soak the hip in water in a bathtub or pool until the sutures are removed. Typically getting into a bath or pool is permitted 4 weeks after surgery.  9. Driving is permitted after 1 week for L hip surgery only if the narcotic pain medication is no longer being taken and you feel comfortable getting into and out of a car. For R hip surgery, driving is permitted after 2 weeks, but frequently may take up to 4 weeks to feel comfortable 10. Please ensure you have a follow-up appointment for suture removal ~2 weeks after surgery.  11. The anesthetic drugs used during your surgery may cause nausea for the first 24 hours. If nausea is encountered, drink only clear liquids (i.e. Sprite or 7-up). The only solids should be dry crackers or toast. If nausea and vomiting become severe or the patient shows sign of dehydration (lack of urination) please call the doctor or the surgicenter. 12. If you develop a fever (101.5),  redness, or yellow/brown/green drainage from the surgical incision site, please call our office to arrange for an evaluation.  13: POST-OPERATIVE PRESCRIPTIONS:  HETERTOPIC BONE PROPHYLAXIS FOR 30 DAYS: 1. EC-Naprosyn 500mg , 1 tablet by mouth two times per day x 30 days 2. Prilosec (Stomach Prophylaxis) 20mg , 1 tablet by mouth daily (take on an empty stomach x 30 days  DVT PROPHYLAXIS 3. Aspirin  325mg  by mouth daily x 2 weeks  PAIN MEDICATION:  4. Oxycodone  1 to 3 tablets by mouth every 4 hours as needed 5. Tylenol  1000mg  three times day for at least 5 days, then as needed to reduce narcotics  ANTI-NAUSEA (if applicable):  6. Zofran  4mg  tablet, 1 tablet every 6 hours as needed. You will be given a prescription, but it is optional to fill it.  ANTI-SPASM (if applicable):  7. Zanaflex 4mg , 2 tablets by mouth every 6 hours as needed.  ANTI-CONSTIPATION 8. Colace 100mg , 2 tablets by mouth daily (to prevent constipation with use of narcotic medications)    14. You will take as aspirin  (325 mg) daily x 2 weeks. This may lower the risk of a blood clot developing after surgery. Should severe calf pain occur or significant swelling of calf and ankle, please call our offices. 15. Local anesthetics (i.e. Novocaine) are put into the incision after surgery. It is not uncommon for patients to encounter more pain on the first or second day after surgery. This is the time when swelling peaks. Taking pain medication before bedtime will assist  in sleeping. It is important not to drink alcohol or drive while taking narcotic medication. You should resume your normal medications for other conditions the day after surgery. 16. Follow weight bearing instructions as advised at discharge. Crutches may be necessary to assist walking. Extremity elevation for the first 72 hours is also encouraged to minimize swelling. 17. If unexpected problems occur and you need to speak to the doctor, call the  office.   Important Contact Information Vikki Sol Producer, Television/film/video): [phone] Fax Number: [phone]  POLAR CARE INFORMATION  [email or web address]  How to use [employer] Polar [facility] Therapy System?  YouTube   [email or web address]  OPERATING INSTRUCTIONS  Start the product With dry hands, connect the transformer to the electrical connection located on the top of the cooler. Next, plug the transformer into an appropriate electrical outlet. The unit will automatically start running at this point.  To stop the pump, disconnect electrical power.  Unplug to stop the product when not in use. Unplugging the Polar Care unit turns it off. Always unplug immediately after use. Never leave it plugged in while unattended. Remove pad.    FIRST ADD WATER TO FILL LINE, THEN ICE---Replace ice when existing ice is almost melted  1 Discuss Treatment with your Licensed Health Care Practitioner and Use Only as Prescribed 2 Apply Insulation Barrier & Cold Therapy Pad 3 Check for Moisture 4 Inspect Skin Regularly  Tips and Trouble Shooting Usage Tips 1. Use cubed or chunked ice for optimal performance. 2. It is recommended to drain the Pad between uses. To drain the pad, hold the Pad upright with the hose pointed toward the ground. Depress the black plunger and allow water to drain out. 3. You may disconnect the Pad from the unit without removing the pad from the affected area by depressing the silver tabs on the hose coupling and gently pulling the hoses apart. The Pad and unit will seal itself and will not leak. Note: Some dripping during release is normal. 4. DO NOT RUN PUMP WITHOUT WATER! The pump in this unit is designed to run with water. Running the unit without water will cause permanent damage to the pump. 5. Unplug unit before removing lid.  TROUBLESHOOTING GUIDE Pump not running, Water not flowing to the pad, Pad is not getting cold 1. Make sure the  transformer is plugged into the wall outlet. 2. Confirm that the ice and water are filled to the indicated levels. 3. Make sure there are no kinks in the pad. 4. Gently pull on the blue tube to make sure the tube/pad junction is straight. 5. Remove the pad from the treatment site and ll it while the pad is lying at; then reapply. 6. Confirm that the pad couplings are securely attached to the unit. Listen for the double clicks (Figure 1) to confirm the pad couplings are securely attached.  Leaks    Note: Some condensation on the lines, controller, and pads is unavoidable, especially in warmer climates. 1. If using a [employer] Polar Care Cold Therapy unit with a detachable Cold Therapy Pad, and a leak exists (other than condensation on the lines) disconnect the pad couplings. Make sure the silver tabs on the couplings are depressed before reconnecting the pad to the pump hose; then confirm both sides of the coupling are properly clicked in. 2. If the coupling continues to leak or a leak is detected in the pad itself, stop using it and call [employer] Customer Care at [phone].  Cleaning  After use, empty and dry the unit with a soft cloth. Warm water and mild detergent may be used occasionally to clean the pump and tubes.  WARNING: The Polar Care Cube can be cold enough to cause serious injury, including full skin necrosis. Follow these Operating Instructions, and carefully read the Product Insert (see pouch on side of unit) and the Cold Therapy Pad Fitting Instructions (provided with each Cold Therapy Pad) prior to use.

## 2024-03-12 NOTE — H&P (Signed)
 Paper H&P to be scanned into permanent record. H&P reviewed. No significant changes noted.

## 2024-03-12 NOTE — TOC CM/SW Note (Cosign Needed)
 Transition of Care (TOC) CM/SW Note   .Patient is not able to walk the distance required to go the bathroom, or he/she is unable to safely negotiate stairs required to access the bathroom.  A 3in1 BSC will alleviate this problem

## 2024-03-12 NOTE — Op Note (Signed)
 Operative Note    SURGERY DATE: 03/12/2024   PRE-OP DIAGNOSIS:  1. Right femoroacetabular impingement 2. Right hip labral tear   POST-OP DIAGNOSIS:  1. Right femoroacetabular impingement 2. Right hip labral tear   PROCEDURES:  1. Right hip arthroscopy with acetabuloplasty, labral repair, femoral osteochondroplasty, and capsular closure   SURGEON: Earnestine HILARIO Blanch, MD  ASSISTANT: Krystal Doyne, PA   ANESTHESIA: Gen   ESTIMATED BLOOD LOSS: minimal   TOTAL IV FLUIDS: per anesthesia  INDICATION(S): The patient is a 43 y.o. year old male who presents with persistent hip pain.  Radiographs demonstrated FAI morphology and the MRI revealed a labral tear.  He has failed greater than 3 months of non-operative treatment to date including activity modifications, physical therapy, and corticosteroid injection.  Please see the preoperative notes for further detail.   He elected to undergo the above mentioned procedure after detailed explanation of the expected outcomes and recovery path.   Informed consent was obtained outlining the expected benefits and possible risks of the surgery including a less than 5% chance of numbness in the sciatic or pudendal nerve regions, 20% chance of injury to the lateral femoral cutaneous nerve (1% permanent injury). Other risks include continued pain due to preexisting chondromalacia and other general risks of surgery such as blood clots, infection and bleeding.  OPERATIVE FINDINGS: Cartilage No significant degenerative changes of the acetabular cartilage along the labral tear High grade cartilage lesion: no Delamination: no Bone exposed: no Bruising: no Localization of femoral head high grade lesion: none  Cotyloid fossa osteophytes:  none The remainder of the femoral and acetabular cartilage was normal.  Labrum Labral degeneration, yellow over 50% of labrum: no Complexity of tearing:  Tear at chondrolabral junction Hypoplastic labrum: no Hyperplastic  labrum:  no Lipstick sign at the psoas prominence: none Psoas Prominence: no  Boundaries of labral tear Convention (3 o'clock anterior, 9 o'clock posterior) Anterior boundary: 12 o'clock Posterior boundary: 3:30 o'clock  Ligamentum teres Hypertrophy:  no Tear: no   OPERATIVE REPORT:  The patient was brought to the operating room, placed supine on the operating table, and bony prominences were padded.  The traction boots were applied with padding to ensure that safe traction could be applied through the feet.  The contralateral limb was abducted slightly and light traction was applied.  The operative leg was brought into neutral position.  Appropriate preoperative IV antibiotics were administered. The patient was prepped and draped in a sterile fashion.  Time-out was performed and landmarks were identified. An air arthrogram was obtained by injecting 30cc into the hip joint while traction was pulled. This broke the labral seal allowing for distraction of the hip. Care was taken to ensure the least amount of force necessary to allow safe access to the joint of 8-34mm.  This was checked with fluoroscopy.   Next we placed an anterolateral portal under the assistance of fluoroscopy.  First, fluoroscopy was used to estimate the trajectory and starting point.  A 5mm incision with a #11 blade was made and a straight hemostat was used to dilate the portal through the appropriate tract.  We then placed a 14-gauge hypodermic needle with careful technique to be as close to the femoral head as possible and parallel to the sourcil to ensure no iatrogenic damage to the labrum.  This released the negative pressure environment and the amount of traction was adjusted to maintain the 8-69mm of distraction.  A nitinol wire was placed through the needle and flouroscopy was  used to ensure it extended to the medial wall of the acetabulum.  The Flowport from [employer] was placed over the wire and the nitinol  wire was retracted to just inside the capsule during insertion of the dilator and cannula to minimize the risk of breakage. The arthroscope was placed next and we visualized the anterior triangle.  We then placed the anterior portal under direct visualization using the technique described above.  This was safely placed as well without damage to the labrum or femoral head.  We then switched our arthroscope to the anterior portal to ensure we were not through the labrum - we were safely through the capsule only.  We then proceeded with a transverse capsulotomy connecting the 2 portals in the same plane utilizing the Samurai blade from [employer].  The Injector device from [employer] was used to place traction stitches each in the medial and lateral arms of the proximal capsule.  A Kelly clamp was used to hold the suture against the skin to apply traction. This allowed access to the acetabular rim and labrum as well as protection of the native edges of the capsule.  We identified the anterior inferior iliac spine proximally, the psoas tendon medially and the rectus tendon laterally as landmarks.  We then proceeded with a diagnostic arthroscopy - the results can be found in the findings section above.   We then used the 50 degree hip specific radiofrequency device and a 4mm shaver to clear the superior acetabulum and expose the subspinous region.  Next we exposed the acetabular rim leaving the chondrolabral junction intact.  There was significant anterior acetabulum overcoverage, consistent with preoperative plan. Working from both portals, the acetabular rim/subspinous region was reshaped with a 4.86mm diamond burr consistent with the preoperative three-dimensional imaging.    When adequate reshaping was confirmed with fluoroscopy, we then proceeded with the labral repair.  A distal anterolateral portal was placed under direct visualization and the Transport cannula was inserted.  Care was taken to ensure the  cannula was in the intermuscular plane between the gluteus minimus and iliocapsularis.  This portal was approximately 5cm distal and 1cm anterior to the anterolateral portal.      We placed 4 Cinchlock anchors at the 1 o'clock, 2 o'clock and 3 o'clock positions with a circumferential stitch. The sutures were passed using the crescent Nanopass from [employer].  Additionally, one Nanotak anchor was placed at the 3:30 position to further secure the medial aspect of the repair. Suture was passed and knot was tied. This construct resulted in anatomic labral repair.  We debrided the loose cartilage at the rim and residual degenerative labral tissue.  Traction was let down with total traction time of 143 minutes.  We then turned our attention to the peripheral compartment.  We flexed the hip to 45 degrees. We viewed from the anterolateral portal and worked from the distal anterolateral portal.  First, we passed two traction stitches in the medial and lateral sides of the capsulotomy.  Traction stitches were passed through the distal capsule, 1 medially and one laterally.  This allowed for excellent view of the entire CAM lesion. The capsule was retracted with a switching stick through the AL portal when necessary.  We were able to view the medial and lateral synovial folds, identifying the medial and lateral circumflex arteries.  These were protected during the osteoplasty.  A 5.36mm burr was used to reshape the femoral neck.  The initial line of resection was  defined with the use of dynamic exam and fluoroscopy.  The line was parallel to the acetabular rim with the leg in neutral rotation.  We viewed the base of the femoral neck to provide a foundation for the shape and size of the osteochondroplasty. We were able to adequately remove the cam lesion and reshape the femoral neck.  This was confirmed with fluoroscopy.  Dynamic exam showed no residual impingement flexed to 90 degrees with maximal internal rotation and  external rotation.  Finally, we performed a complete capsular closure with Zipline suture from [employer], utilizing the Slingshot from [employer] to pass the suture.  4 simple stitches were placed across the interportal capsulotomy. These were then tied sequentially with alternating half hitches through an 8.15mm Transport cannula. Reapproximation of the capsule was confirmed.   We then removed the arthroscope and closed the incisions with 2-0 Vicryl subdermally and 3-0 nylon stitches. Local anesthetic was injected about the portals and tracts down to the joint. A sterile dressing was applied and hip brace was applied.  The patient was awakened from anesthesia and transferred to PACU in stable condition.  Of note, all extremities and available joints were mobilized approximately every 1-2 hours during this surgery to avoid complications from prolonged immobilization.   Of note, assistance from a PA was essential to performing the surgery. PA assisted with patient positioning, retraction, and instrumentation. The surgery would have been more difficult and had longer operative time without PA assistance.   POSTOPERATIVE PLAN: The patient will be discharge home from PACU.  Flatfoot weightbearing with hip abduction brace x3 weeks. ASA 325mg /day x 2 weeks for DVT ppx. Patient will follow-up as an outpatient in 2 weeks.  Outpatient physical therapy to start in 3-4 days.

## 2024-03-12 NOTE — Evaluation (Signed)
 Occupational Therapy Evaluation Patient Details Name: Cristian Fernandez MRN: [id number] DOB: Dec 05, 1980 Today's Date: 03/12/2024   History of Present Illness   The patient is a 43 y.o. year old male who presents with persistent hip pain.  Radiographs demonstrated FAI morphology and the MRI revealed a labral tear.  He has failed greater than 3 months of non-operative treatment to date including activity modifications, physical therapy, and corticosteroid injection. He is s/p R hip arthroscopy, labral repair, acetabuloscopy, femoral osteochondroplasty and capsule closure by Dr. Tobie on 11/18.     Clinical Impressions Chart reviewed to date, pt greeted sitting on edge of bed, hand off from PT. Pt is alert and oriented x4, agreeable to OT evaluation. Wife present throughout. PTA pt is MOD I-I in ADL/IADL, amb with no AD. Pt/ wife provided education re: polar care mgt, falls prevention strategies, home/routines modifications, DME/AE for LB bathing and dressing tasks, brace management. Hand out provided to facilitate carry over. Good carry over of education noted. Pt is dizzy/nauseous with sustained standing during LB dressing/brace management, BP recorded immediately after sitting down and MAP is 85. Nurse notified. OT will follow acutely.      If plan is discharge home, recommend the following:   A little help with walking and/or transfers;A little help with bathing/dressing/bathroom;Assistance with cooking/housework;Help with stairs or ramp for entrance     Functional Status Assessment   Patient has had a recent decline in their functional status and demonstrates the ability to make significant improvements in function in a reasonable and predictable amount of time.     Equipment Recommendations   BSC/3in1;Other (comment) (2WW)     Recommendations for Other Services         Precautions/Restrictions   Precautions Precautions: Fall Recall of Precautions/Restrictions:  Intact Required Braces or Orthoses: Other Brace Other Brace: hip abd brace Restrictions Weight Bearing Restrictions Per Provider Order: Yes RLE Weight Bearing Per Provider Order: Touchdown weight bearing (per op note .  Flatfoot weightbearing with hip abduction brace x3 weeks.)     Mobility Bed Mobility Overal bed mobility: Needs Assistance Bed Mobility: Sit to Supine       Sit to supine: Min assist        Transfers Overall transfer level: Needs assistance Equipment used: Rolling walker (2 wheels) Transfers: Sit to/from Stand Sit to Stand: Contact guard assist                  Balance Overall balance assessment: Needs assistance Sitting-balance support: Feet supported, No upper extremity supported Sitting balance-Leahy Scale: Good     Standing balance support: Bilateral upper extremity supported, Reliant on assistive device for balance, During functional activity Standing balance-Leahy Scale: Fair                             ADL either performed or assessed with clinical judgement   ADL Overall ADL's : Needs assistance/impaired                     Lower Body Dressing: Moderate assistance;Sitting/lateral leans Lower Body Dressing Details (indicate cue type and reason): doff/donn brace; donn underwear/pants; wife with approrpiate knowledge in assisting; provided hand out Toilet Transfer: Rolling walker (2 wheels);Contact guard assist Toilet Transfer Details (indicate cue type and reason): simulated                 Vision Patient Visual Report: No change from baseline  Perception         Praxis         Pertinent Vitals/Pain Pain Assessment Pain Assessment: 0-10 Pain Score: 1  Pain Descriptors / Indicators: Grimacing     Extremity/Trunk Assessment Upper Extremity Assessment Upper Extremity Assessment: Overall WFL for tasks assessed   Lower Extremity Assessment Lower Extremity Assessment: Defer to PT  evaluation RLE Deficits / Details: FFWB with abd brace RLE: Unable to fully assess due to immobilization RLE Sensation: WNL   Cervical / Trunk Assessment Cervical / Trunk Assessment: Normal   Communication Communication Communication: No apparent difficulties   Cognition Arousal: Alert Behavior During Therapy: WFL for tasks assessed/performed Cognition: No apparent impairments                               Following commands: Intact       Cueing  General Comments   Cueing Techniques: Verbal cues  Pt reports feeling nauseous/dizzy with sustained standing, MAP >65 immediately return to sitting   Exercises     Shoulder Instructions      Home Living Family/patient expects to be discharged to:: Private residence Living Arrangements: Spouse/significant other Available Help at Discharge: Family Type of Home: House Home Access: Level entry     Home Layout: One level     Bathroom Shower/Tub: Producer, Television/film/video: Administrator Accessibility: Yes   Home Equipment: Crutches   Additional Comments: pt lives in a multi-level home but plans to live in fully functional basement with level entry during his recovery      Prior Functioning/Environment Prior Level of Function : Independent/Modified Independent             Mobility Comments: IND with all mobility ADLs Comments: IND with all ADLs    OT Problem List: Impaired balance (sitting and/or standing);Decreased activity tolerance   OT Treatment/Interventions: Self-care/ADL training;Balance training;Therapeutic exercise;Therapeutic activities;Patient/family education      OT Goals(Current goals can be found in the care plan section)   Acute Rehab OT Goals Patient Stated Goal: go home OT Goal Formulation: With patient/family Time For Goal Achievement: 03/26/24 Potential to Achieve Goals: Good ADL Goals Pt Will Perform Lower Body Dressing: with modified independence;with  caregiver independent in assisting Pt Will Transfer to Toilet: with modified independence;ambulating   OT Frequency:  Min 1X/week    Co-evaluation              AM-PAC OT 6 Clicks Daily Activity     Outcome Measure Help from another person eating meals?: None Help from another person taking care of personal grooming?: None Help from another person toileting, which includes using toliet, bedpan, or urinal?: A Little Help from another person bathing (including washing, rinsing, drying)?: A Little Help from another person to put on and taking off regular upper body clothing?: None Help from another person to put on and taking off regular lower body clothing?: A Little 6 Click Score: 21   End of Session Equipment Utilized During Treatment: Rolling walker (2 wheels) Nurse Communication: Mobility status  Activity Tolerance: Patient tolerated treatment well Patient left: in bed;with call bell/phone within reach;with family/visitor present  OT Visit Diagnosis: Other abnormalities of gait and mobility (R26.89)                Time: 8573-8556 OT Time Calculation (min): 17 min Charges:  OT General Charges $OT Visit: 1 Visit OT Evaluation $OT Eval Low Complexity: 1 Low  Therisa Sheffield, OTD OTR/L  03/12/24, 3:58 PM

## 2024-03-13 ENCOUNTER — Encounter: Payer: Self-pay | Admitting: Orthopedic Surgery
